# Patient Record
Sex: Female | Born: 1985 | Race: Black or African American | Hispanic: No | Marital: Married | Smoking: Never smoker
Health system: Southern US, Community
[De-identification: ages and names within clinical notes are randomized; demographics above are authoritative.]

## PROBLEM LIST (undated history)

## (undated) DIAGNOSIS — M329 Systemic lupus erythematosus, unspecified: Secondary | ICD-10-CM

## (undated) DIAGNOSIS — M4150 Other secondary scoliosis, site unspecified: Secondary | ICD-10-CM

## (undated) DIAGNOSIS — J45909 Unspecified asthma, uncomplicated: Secondary | ICD-10-CM

## (undated) HISTORY — DX: Systemic lupus erythematosus, unspecified: M32.9

## (undated) HISTORY — DX: Unspecified asthma, uncomplicated: J45.909

## (undated) HISTORY — DX: Other secondary scoliosis, site unspecified: M41.50

---
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
## 2004-09-12 ENCOUNTER — Encounter: Payer: Self-pay | Admitting: Family Medicine

## 2004-09-12 LAB — CONVERTED CEMR LAB

## 2005-05-03 ENCOUNTER — Ambulatory Visit: Payer: Self-pay | Admitting: Family Medicine

## 2005-07-09 ENCOUNTER — Ambulatory Visit: Payer: Self-pay | Admitting: Family Medicine

## 2005-09-12 ENCOUNTER — Ambulatory Visit: Payer: Self-pay | Admitting: Family Medicine

## 2005-09-18 ENCOUNTER — Ambulatory Visit: Payer: Self-pay | Admitting: Family Medicine

## 2005-11-01 ENCOUNTER — Ambulatory Visit: Payer: Self-pay | Admitting: Family Medicine

## 2006-05-20 DIAGNOSIS — M4150 Other secondary scoliosis, site unspecified: Secondary | ICD-10-CM

## 2006-05-20 DIAGNOSIS — J45909 Unspecified asthma, uncomplicated: Secondary | ICD-10-CM

## 2006-05-20 HISTORY — DX: Unspecified asthma, uncomplicated: J45.909

## 2006-05-20 HISTORY — DX: Other secondary scoliosis, site unspecified: M41.50

## 2006-05-27 ENCOUNTER — Encounter: Payer: Self-pay | Admitting: Family Medicine

## 2006-06-04 ENCOUNTER — Encounter: Payer: Self-pay | Admitting: Family Medicine

## 2007-03-19 ENCOUNTER — Encounter: Payer: Self-pay | Admitting: Family Medicine

## 2007-03-19 ENCOUNTER — Other Ambulatory Visit: Admission: RE | Admit: 2007-03-19 | Discharge: 2007-03-19 | Payer: Self-pay | Admitting: Family Medicine

## 2007-03-19 ENCOUNTER — Ambulatory Visit: Payer: Self-pay | Admitting: Family Medicine

## 2007-08-05 ENCOUNTER — Telehealth (INDEPENDENT_AMBULATORY_CARE_PROVIDER_SITE_OTHER): Payer: Self-pay | Admitting: *Deleted

## 2007-08-05 ENCOUNTER — Encounter: Admission: RE | Admit: 2007-08-05 | Discharge: 2007-08-05 | Payer: Self-pay | Admitting: Family Medicine

## 2007-08-05 ENCOUNTER — Ambulatory Visit: Payer: Self-pay | Admitting: Family Medicine

## 2007-08-05 DIAGNOSIS — M79609 Pain in unspecified limb: Secondary | ICD-10-CM

## 2007-10-20 ENCOUNTER — Ambulatory Visit: Payer: Self-pay | Admitting: Family Medicine

## 2007-10-20 ENCOUNTER — Encounter: Payer: Self-pay | Admitting: Family Medicine

## 2007-10-20 ENCOUNTER — Other Ambulatory Visit: Admission: RE | Admit: 2007-10-20 | Discharge: 2007-10-20 | Payer: Self-pay | Admitting: Family Medicine

## 2007-10-20 DIAGNOSIS — N760 Acute vaginitis: Secondary | ICD-10-CM | POA: Insufficient documentation

## 2007-10-20 LAB — CONVERTED CEMR LAB: Trich, Wet Prep: NONE SEEN

## 2008-03-31 ENCOUNTER — Ambulatory Visit: Payer: Self-pay | Admitting: Family Medicine

## 2008-03-31 DIAGNOSIS — R21 Rash and other nonspecific skin eruption: Secondary | ICD-10-CM

## 2008-03-31 DIAGNOSIS — R5383 Other fatigue: Secondary | ICD-10-CM

## 2008-03-31 DIAGNOSIS — R5381 Other malaise: Secondary | ICD-10-CM

## 2008-03-31 DIAGNOSIS — G5600 Carpal tunnel syndrome, unspecified upper limb: Secondary | ICD-10-CM

## 2008-03-31 LAB — CONVERTED CEMR LAB
Bilirubin Urine: NEGATIVE
Blood in Urine, dipstick: NEGATIVE
Glucose, Urine, Semiquant: NEGATIVE
Nitrite: NEGATIVE
Specific Gravity, Urine: 1.025
Urobilinogen, UA: 0.2
pH: 6

## 2008-04-04 ENCOUNTER — Encounter: Payer: Self-pay | Admitting: Family Medicine

## 2008-04-04 ENCOUNTER — Telehealth (INDEPENDENT_AMBULATORY_CARE_PROVIDER_SITE_OTHER): Payer: Self-pay | Admitting: *Deleted

## 2008-04-04 LAB — CONVERTED CEMR LAB
ALT: 11 units/L (ref 0–35)
ANA Titer 1: 1:320 {titer} — ABNORMAL HIGH
AST: 17 units/L (ref 0–37)
Albumin: 4 g/dL (ref 3.5–5.2)
Alkaline Phosphatase: 79 units/L (ref 39–117)
Anti Nuclear Antibody(ANA): POSITIVE — AB
BUN: 12 mg/dL (ref 6–23)
CO2: 20 meq/L (ref 19–32)
Calcium: 9.6 mg/dL (ref 8.4–10.5)
Chloride: 103 meq/L (ref 96–112)
Creatinine, Ser: 0.85 mg/dL (ref 0.40–1.20)
Folate: 19.1 ng/mL
Glucose, Bld: 106 mg/dL — ABNORMAL HIGH (ref 70–99)
HCT: 37.1 % (ref 36.0–46.0)
Hemoglobin: 13 g/dL (ref 12.0–15.0)
MCHC: 35 g/dL (ref 30.0–36.0)
MCV: 74.5 fL — ABNORMAL LOW (ref 78.0–100.0)
Platelets: 367 10*3/uL (ref 150–400)
Potassium: 3.9 meq/L (ref 3.5–5.3)
RBC: 4.98 M/uL (ref 3.87–5.11)
RDW: 13.7 % (ref 11.5–15.5)
Rheumatoid fact SerPl-aCnc: 247 intl units/mL — ABNORMAL HIGH (ref 0–20)
Sed Rate: 36 mm/hr — ABNORMAL HIGH (ref 0–22)
Sodium: 136 meq/L (ref 135–145)
TSH: 2.34 microintl units/mL (ref 0.350–4.50)
Total Bilirubin: 0.4 mg/dL (ref 0.3–1.2)
Total Protein: 8.8 g/dL — ABNORMAL HIGH (ref 6.0–8.3)
Vitamin B-12: 734 pg/mL (ref 211–911)
WBC: 9 10*3/uL (ref 4.0–10.5)

## 2008-04-05 LAB — CONVERTED CEMR LAB
Iron: 33 ug/dL — ABNORMAL LOW (ref 42–145)
Saturation Ratios: 8 % — ABNORMAL LOW (ref 20–55)
TIBC: 395 ug/dL (ref 250–470)
UIBC: 362 ug/dL

## 2008-10-18 ENCOUNTER — Encounter: Payer: Self-pay | Admitting: Family Medicine

## 2008-10-18 ENCOUNTER — Other Ambulatory Visit: Admission: RE | Admit: 2008-10-18 | Discharge: 2008-10-18 | Payer: Self-pay | Admitting: Family Medicine

## 2008-10-18 ENCOUNTER — Ambulatory Visit: Payer: Self-pay | Admitting: Family Medicine

## 2008-10-18 DIAGNOSIS — M329 Systemic lupus erythematosus, unspecified: Secondary | ICD-10-CM | POA: Insufficient documentation

## 2008-10-18 HISTORY — DX: Systemic lupus erythematosus, unspecified: M32.9

## 2008-10-19 ENCOUNTER — Encounter: Payer: Self-pay | Admitting: Family Medicine

## 2008-10-19 LAB — CONVERTED CEMR LAB
Cholesterol: 197 mg/dL (ref 0–200)
Clue Cells Wet Prep HPF POC: NONE SEEN
LDL Cholesterol: 129 mg/dL — ABNORMAL HIGH (ref 0–99)
Trich, Wet Prep: NONE SEEN
VLDL: 19 mg/dL (ref 0–40)

## 2008-10-21 ENCOUNTER — Telehealth: Payer: Self-pay | Admitting: Family Medicine

## 2008-10-21 DIAGNOSIS — R87619 Unspecified abnormal cytological findings in specimens from cervix uteri: Secondary | ICD-10-CM

## 2008-11-14 ENCOUNTER — Encounter: Payer: Self-pay | Admitting: Family Medicine

## 2009-03-01 ENCOUNTER — Telehealth (INDEPENDENT_AMBULATORY_CARE_PROVIDER_SITE_OTHER): Payer: Self-pay | Admitting: *Deleted

## 2009-03-21 ENCOUNTER — Ambulatory Visit: Payer: Self-pay | Admitting: Family Medicine

## 2009-03-21 DIAGNOSIS — J019 Acute sinusitis, unspecified: Secondary | ICD-10-CM

## 2009-03-21 DIAGNOSIS — R1313 Dysphagia, pharyngeal phase: Secondary | ICD-10-CM

## 2010-05-25 ENCOUNTER — Ambulatory Visit: Payer: Self-pay | Admitting: Family Medicine

## 2010-05-25 ENCOUNTER — Other Ambulatory Visit: Admission: RE | Admit: 2010-05-25 | Discharge: 2010-05-25 | Payer: Self-pay | Admitting: Family Medicine

## 2010-05-26 ENCOUNTER — Encounter: Payer: Self-pay | Admitting: Family Medicine

## 2010-05-27 LAB — CONVERTED CEMR LAB
AST: 47 units/L — ABNORMAL HIGH (ref 0–37)
Albumin: 4.7 g/dL (ref 3.5–5.2)
Alkaline Phosphatase: 70 units/L (ref 39–117)
Basophils Absolute: 0 10*3/uL (ref 0.0–0.1)
Basophils Relative: 1 % (ref 0–1)
Clue Cells Wet Prep HPF POC: NONE SEEN
Eosinophils Absolute: 0 10*3/uL (ref 0.0–0.7)
MCHC: 35 g/dL (ref 30.0–36.0)
MCV: 75.6 fL — ABNORMAL LOW (ref 78.0–100.0)
Neutrophils Relative %: 49 % (ref 43–77)
Platelets: 172 10*3/uL (ref 150–400)
Potassium: 4.6 meq/L (ref 3.5–5.3)
Sodium: 140 meq/L (ref 135–145)
Total Protein: 7.9 g/dL (ref 6.0–8.3)
WBC: 4.4 10*3/uL (ref 4.0–10.5)

## 2010-05-28 ENCOUNTER — Encounter: Payer: Self-pay | Admitting: Family Medicine

## 2010-05-29 LAB — CONVERTED CEMR LAB
Pap Smear: NEGATIVE
Pap Smear: NORMAL

## 2010-09-11 NOTE — Assessment & Plan Note (Signed)
Summary: F/U SLE, pap   Vital Signs:  Patient profile:   25 year old female Height:      66 inches Weight:      188 pounds BMI:     30.45 Pulse rate:   84 / minute BP sitting:   115 / 77  (right arm) Cuff size:   regular  Vitals Entered By: Avon Gully CMA, Duncan Dull) (May 25, 2010 9:18 AM) CC: needs labs for lupus   Primary Care Provider:  Nani Gasser MD  CC:  needs labs for lupus.  History of Present Illness: Last set up labs was 2 months ago. Normally goes every 6 weeks. Said A1C was elevated (6.1).  Overdue for eye exam.  Completely out of the plaquenil. Out for 3 weeks. Moved to [facility] and jut back here to visit her sister who just had surgery. She is on plaqunil but is off her steroids. She is very happy to be off the steroids. SHe has gained 30 lbs since the last time I saw her.   She also needs a pap smear.   Current Medications (verified): 1)  Albuterol 90 Mcg/act Aers (Albuterol) .... 2 Puffs Before Exercise 2)  Methotrexate 2.5 Mg Tabs (Methotrexate Sodium) .... Take 6 Pills One Day A Week 3)  Folic Acid 400 Mcg Tabs (Folic Acid) .... Take One Tablet By Mouth Once A Day 4)  Fish Oil Concentrate 1000 Mg Caps (Omega-3 Fatty Acids) .... Take One Tablet By Mouth Once A Day 5)  Multivitamins  Tabs (Multiple Vitamin) .... Take One Tablet By Mouth Once Ad Ay 6)  Plaquenil 200 Mg Tabs (Hydroxychloroquine Sulfate) .... Take 1 Tablet By Mouth Once A Day 7)  Dexilant 30 Mg Cpdr (Dexlansoprazole)  Allergies (verified): 1)  ! Lodine  Comments:  Nurse/Medical Assistant: The patient's medications and allergies were reviewed with the patient and were updated in the Medication and Allergy Lists. Avon Gully CMA, Duncan Dull) (May 25, 2010 9:19 AM)  Family History: Aunt-DM, Grandparent -depression, HTN, Great Aunt -alcoholism Sister wtih severe scoliosis.   Social History: Reviewed history from 03/19/2007 and no changes required. 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  Single.  Live at home.  Plays basketball.  Never smoked.  No EtOH, no drugs, 2 caffeinated drinks per day.  Physical Exam  General:  Well-developed,well-nourished,in no acute distress; alert,appropriate and cooperative throughout examination Head:  Normocephalic and atraumatic without obvious abnormalities. No apparent alopecia or balding. Neck:  No deformities, masses, or tenderness noted. NO TM.  Lungs:  Normal respiratory effort, chest expands symmetrically. Lungs are clear to auscultation, no crackles or wheezes. Heart:  Normal rate and regular rhythm. S1 and S2 normal without gallop, murmur, click, rub or other extra sounds. Genitalia:  Normal introitus for age, no external lesions, no vaginal discharge, mucosa pink and moist, no vaginal or cervical lesions, no vaginal atrophy, no friaility or hemorrhage, normal uterus size and position, no adnexal masses or tenderness Skin:  no rashes.   Cervical Nodes:  No lymphadenopathy noted Psych:  Cognition and judgment appear intact. Alert and cooperative with normal attention span and concentration. No apparent delusions, illusions, hallucinations   Impression & Recommendations:  Problem # 1:  SLE (ICD-710.0)  I will refill her plaquenil for 8 week. This should be plenty of time to find a new rheumatologist in [place]. Discused teh improtance of an EYE EXAM [place] as soon as gets back to [place] next week. Her last eye  exam was 8 months ago. Let her know could have permanent vision loss if she is not verey diligent about her eye exams.  The following medications were removed from the medication list:    Prednisone 5 Mg Tabs (Prednisone) .Marland Kitchen... Take one tablet by mouth once a day    Mobic 7.5 Mg Tabs (Meloxicam) .Marland Kitchen... Take 1 tablet by mouth once a day Her updated medication list for this problem includes:    Methotrexate 2.5 Mg Tabs (Methotrexate sodium) .Marland Kitchen... Take 6 pills one day a week     Plaquenil 200 Mg Tabs (Hydroxychloroquine sulfate) .Marland Kitchen... Take 1 tablet by mouth once a day  Orders: Fingerstick (84132) Hgb A1C (44010UV) T-CBC w/Diff (25366-44034) T-Comprehensive Metabolic Panel (74259-56387)  Problem # 2:  PAP SMEAR, ABNORMAL (ICD-795.00) Due for repeat pap today. Will check for GC, [place] and will send a wet prep. Had a yeast infeciton recently and use an OTC tx. Her sxs are better.   Complete Medication List: 1)  Albuterol 90 Mcg/act Aers (Albuterol) .... 2 puffs before exercise 2)  Methotrexate 2.5 Mg Tabs (Methotrexate sodium) .... Take 6 pills one day a week 3)  Folic Acid 400 Mcg Tabs (Folic acid) .... Take one tablet by mouth once a day 4)  Fish Oil Concentrate 1000 Mg Caps (Omega-3 fatty acids) .... Take one tablet by mouth once a day 5)  Multivitamins Tabs (Multiple vitamin) .... Take one tablet by mouth once ad ay 6)  Plaquenil 200 Mg Tabs (Hydroxychloroquine sulfate) .... Take 1 tablet by mouth once a day 7)  Dexilant 30 Mg Cpdr (Dexlansoprazole)  Other Orders: T-Wet Prep ([id number]) Admin 1st Vaccine (84166) Flu Vaccine 27yrs + (06301)  Patient Instructions: 1)  You must get your eye exam PRONTO!!!!!! 2)  I will refill your medicine for 8 weeks and give you a chance to find a new rheumatologist.  Prescriptions: PLAQUENIL 200 MG TABS (HYDROXYCHLOROQUINE SULFATE) Take 1 tablet by mouth once a day  #30 x 1   Entered and Authorized by:   Nani Gasser MD   Signed by:   Nani Gasser MD on 05/25/2010   Method used:   Electronically to        [facility] [phone]* (retail)       [address] [place], [place]  [address]       Ph: [phone]       Fax: [phone]   RxID:   [id number]   Laboratory Results   Blood Tests   Date/Time Received: 05/25/10 Date/Time Reported: 05/25/10  HGBA1C: 6.3%   (Normal Range: Non-Diabetic - 3-6%   Control Diabetic - 6-8%)    Flu Vaccine Consent Questions     Do you have a history of  severe allergic reactions to this vaccine? no    Any prior history of allergic reactions to egg and/or gelatin? no    Do you have a sensitivity to the preservative Thimersol? no    Do you have a past history of Guillan-Barre Syndrome? no    Do you currently have an acute febrile illness? no    Have you ever had a severe reaction to latex? no    Vaccine information given and explained to patient? yes    Are you currently pregnant? no    Lot Number:[id number]   Exp Date:02/09/2011   Site Given  Left Deltoid IM HGBA1C: 6.3%   (Normal Range: Non-Diabetic - 3-6%   Control Diabetic - 6-8%)     .  lbflu

## 2011-07-24 ENCOUNTER — Ambulatory Visit (INDEPENDENT_AMBULATORY_CARE_PROVIDER_SITE_OTHER): Payer: BC Managed Care – PPO | Admitting: Family Medicine

## 2011-07-24 ENCOUNTER — Encounter: Payer: Self-pay | Admitting: Family Medicine

## 2011-07-24 VITALS — BP 113/72 | HR 52 | Ht 67.0 in | Wt 192.0 lb

## 2011-07-24 DIAGNOSIS — M329 Systemic lupus erythematosus, unspecified: Secondary | ICD-10-CM

## 2011-07-24 DIAGNOSIS — Z23 Encounter for immunization: Secondary | ICD-10-CM

## 2011-07-24 LAB — CBC
HCT: 36.6 % (ref 36.0–46.0)
Hemoglobin: 12.9 g/dL (ref 12.0–15.0)
MCH: 27 pg (ref 26.0–34.0)
MCV: 76.6 fL — ABNORMAL LOW (ref 78.0–100.0)
RBC: 4.78 MIL/uL (ref 3.87–5.11)

## 2011-07-24 MED ORDER — METHOTREXATE 2.5 MG PO TABS: 15.0000 mg | ORAL_TABLET | ORAL | Status: DC
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
## 2011-08-09 ENCOUNTER — Encounter: Payer: Self-pay | Admitting: *Deleted

## 2012-06-05 ENCOUNTER — Ambulatory Visit (INDEPENDENT_AMBULATORY_CARE_PROVIDER_SITE_OTHER): Payer: Self-pay | Admitting: Family Medicine

## 2012-06-05 ENCOUNTER — Encounter: Payer: Self-pay | Admitting: Family Medicine

## 2012-06-05 VITALS — BP 123/80 | HR 53 | Temp 97.8°F | Wt 200.0 lb

## 2012-06-05 DIAGNOSIS — S134XXA Sprain of ligaments of cervical spine, initial encounter: Secondary | ICD-10-CM

## 2012-06-05 DIAGNOSIS — S139XXA Sprain of joints and ligaments of unspecified parts of neck, initial encounter: Secondary | ICD-10-CM

## 2012-06-05 MED ORDER — CYCLOBENZAPRINE HCL 10 MG PO TABS: 10.0000 mg | ORAL_TABLET | Freq: Three times a day (TID) | ORAL | Status: DC | PRN

## 2012-06-05 MED ORDER — IBUPROFEN 800 MG PO TABS: 800.0000 mg | ORAL_TABLET | Freq: Three times a day (TID) | ORAL | Status: DC | PRN
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
## 2012-06-15 ENCOUNTER — Encounter: Payer: Self-pay | Admitting: Family Medicine

## 2012-06-15 ENCOUNTER — Ambulatory Visit (INDEPENDENT_AMBULATORY_CARE_PROVIDER_SITE_OTHER): Payer: Self-pay | Admitting: Family Medicine

## 2012-06-15 ENCOUNTER — Other Ambulatory Visit: Payer: Self-pay | Admitting: Family Medicine

## 2012-06-15 ENCOUNTER — Ambulatory Visit (INDEPENDENT_AMBULATORY_CARE_PROVIDER_SITE_OTHER): Payer: Self-pay

## 2012-06-15 DIAGNOSIS — M549 Dorsalgia, unspecified: Secondary | ICD-10-CM

## 2012-06-15 DIAGNOSIS — M542 Cervicalgia: Secondary | ICD-10-CM

## 2012-06-15 DIAGNOSIS — M545 Low back pain, unspecified: Secondary | ICD-10-CM

## 2012-06-15 DIAGNOSIS — M546 Pain in thoracic spine: Secondary | ICD-10-CM

## 2012-06-15 NOTE — Patient Instructions (Addendum)
We will call you with the x-ray results. 
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
## 2012-06-17 ENCOUNTER — Other Ambulatory Visit: Payer: Self-pay | Admitting: Family Medicine

## 2012-06-22 ENCOUNTER — Ambulatory Visit: Payer: BC Managed Care – PPO | Attending: Family Medicine | Admitting: Physical Therapy

## 2012-06-22 DIAGNOSIS — M2550 Pain in unspecified joint: Secondary | ICD-10-CM | POA: Insufficient documentation

## 2012-06-22 DIAGNOSIS — IMO0001 Reserved for inherently not codable concepts without codable children: Secondary | ICD-10-CM | POA: Insufficient documentation

## 2012-06-22 DIAGNOSIS — M6281 Muscle weakness (generalized): Secondary | ICD-10-CM | POA: Insufficient documentation

## 2012-06-26 ENCOUNTER — Ambulatory Visit: Payer: BC Managed Care – PPO | Admitting: Physical Therapy

## 2012-06-30 ENCOUNTER — Encounter: Payer: BC Managed Care – PPO | Admitting: Physical Therapy

## 2012-07-17 ENCOUNTER — Encounter: Payer: BC Managed Care – PPO | Admitting: Physical Therapy

## 2013-03-24 ENCOUNTER — Ambulatory Visit: Payer: BC Managed Care – PPO | Admitting: Family Medicine

## 2013-03-25 ENCOUNTER — Ambulatory Visit (INDEPENDENT_AMBULATORY_CARE_PROVIDER_SITE_OTHER): Payer: BC Managed Care – PPO | Admitting: Family Medicine

## 2013-03-25 ENCOUNTER — Telehealth: Payer: Self-pay | Admitting: *Deleted

## 2013-03-25 ENCOUNTER — Encounter: Payer: Self-pay | Admitting: Family Medicine

## 2013-03-25 VITALS — BP 120/79 | HR 67 | Ht 68.0 in | Wt 168.0 lb

## 2013-03-25 DIAGNOSIS — E1165 Type 2 diabetes mellitus with hyperglycemia: Secondary | ICD-10-CM | POA: Insufficient documentation

## 2013-03-25 DIAGNOSIS — IMO0002 Reserved for concepts with insufficient information to code with codable children: Secondary | ICD-10-CM | POA: Insufficient documentation

## 2013-03-25 LAB — POCT GLYCOSYLATED HEMOGLOBIN (HGB A1C): Hemoglobin A1C: >14

## 2013-03-25 LAB — GLUCOSE, POCT (MANUAL RESULT ENTRY): POC Glucose: 268 mg/dl — AB (ref 70–99)

## 2013-03-25 MED ORDER — INSULIN DETEMIR 100 UNIT/ML FLEXPEN: 20.0000 [IU] | PEN_INJECTOR | Freq: Every day | SUBCUTANEOUS | Status: DC

## 2013-03-25 MED ORDER — AMBULATORY NON FORMULARY MEDICATION: Status: DC

## 2013-03-25 MED ORDER — SAXAGLIPTIN-METFORMIN ER 5-500 MG PO TB24: 1.0000 | ORAL_TABLET | Freq: Every day | ORAL | Status: DC

## 2013-03-25 MED ORDER — SAXAGLIPTIN-METFORMIN ER 5-1000 MG PO TB24: 1.0000 | ORAL_TABLET | Freq: Every day | ORAL | Status: DC

## 2013-03-25 NOTE — Telephone Encounter (Signed)
Spoke with Kendra Hart at [facility] to clarify that pt was to get Kombiglyze 12-998; not 5-500.

## 2013-03-25 NOTE — Progress Notes (Signed)
Subjective:    Patient ID: Kendra Hart, female    DOB: [birth year], 27 y.o.   MRN: [id number]  HPI Kendra Hart to UC for an abscess for about a week ago. Had and I&D.  Had noticed some blurry vision and inc urination. Went to rheumatology and had glucose checked 2 days ago and glucose was 590. Next day went down to 400. A1C was > 16.  Her fiance has Type 1 DM.  She denies any recent fever or illnesses. No abdominal pain. She has noticed some tingling and discomfort in her right foot a couple weeks ago but that has resolved. She does have a family history of diabetes. She also has lupus and is currently on immunosuppressive therapy.   Review of Systems BP 120/79  Pulse 67  Ht 5\' 8"  (1.727 m)  Wt 168 lb (76.204 kg)  BMI 25.55 kg/m2    Allergies  Allergen Reactions  . Etodolac     REACTION: hives, SOB    Past Medical History  Diagnosis Date  . SLE 10/18/2008    Qualifier: Diagnosis of  By: Linford Arnold MD, Santina Evans    . SCOLIOSIS 05/20/2006    Qualifier: Diagnosis of  By: Linford Arnold MD, Santina Evans    . EXERCISE INDUCED ASTHMA 05/20/2006    Qualifier: Diagnosis of  By: Linford Arnold MD, Santina Evans      No past surgical history on file.  History   Social History  . Marital Status: Single    Spouse Name: N/A    Number of Children: N/A  . Years of Education: N/A   Occupational History  . Not on file.   Social History Main Topics  . Smoking status: Never Smoker   . Smokeless tobacco: Not on file  . Alcohol Use: Not on file  . Drug Use: Not on file  . Sexual Activity: Not on file   Other Topics Concern  . Not on file   Social History Narrative  . No narrative on file    No family history on file.  Outpatient Encounter Prescriptions as of 03/25/2013  Medication Sig Dispense Refill  . AMBULATORY NON FORMULARY MEDICATION Medication Name: Glucometer, strips and lancets.  Test 1 x a day.  On insulin 250.00  1 Units  0  . cyclobenzaprine (FLEXERIL) 10 MG tablet Take 1 tablet (10 mg total) by  mouth every 8 (eight) hours as needed for muscle spasms.  30 tablet  1  . hydroxychloroquine (PLAQUENIL) 200 MG tablet Take 200 mg by mouth daily.        Marland Kitchen ibuprofen (ADVIL,MOTRIN) 800 MG tablet Take 1 tablet (800 mg total) by mouth every 8 (eight) hours as needed for pain.  45 tablet  1  . Insulin Detemir (LEVEMIR FLEXPEN) 100 UNIT/ML SOPN Inject 20 Units into the skin at bedtime.  4 pen  1  . methotrexate (RHEUMATREX) 2.5 MG tablet Take 6 tablets (15 mg total) by mouth once a week. Caution:Chemotherapy. Protect from light.  24 tablet  1  . Saxagliptin-Metformin (KOMBIGLYZE XR) 5-500 MG TB24 Take 1 tablet by mouth daily.  30 tablet  2   No facility-administered encounter medications on file as of 03/25/2013.           Objective:   Physical Exam  Constitutional: She is oriented to person, place, and time. She appears well-developed and well-nourished.  HENT:  Head: Normocephalic and atraumatic.  Neck: Neck supple. No thyromegaly present.  Cardiovascular: Normal rate, regular rhythm and normal heart sounds.   Pulmonary/Chest:  Effort normal and breath sounds normal.  Lymphadenopathy:    She has no cervical adenopathy.  Neurological: She is alert and oriented to person, place, and time.  Skin: Skin is warm and dry.  Psychiatric: She has a normal mood and affect. Her behavior is normal.          Assessment & Plan:  DM, type 2 - new diagnosis, new onset. discuss tx options.  We will start with a combination medication. Start Kombiglyze 12/998 XR once daily. We will also add Levemir 10 units at bedtime. She will increase by 1 unit each night as her fasting sugar is over 140. When her sugar starts to drop low 140 she can stay at that current dose until I see her back in 2-3 weeks. Given a prescription to get a glucometer that is covered by her insurance. I encouraged her to get diabetic and nutrition counseling. She has taken nutrition, classes in the past and says she's not sure she would  like to do that but will think about it. We also had a discussion about major dietary changes. Cutting back on concentrated sweets, carbohydrates, and sweetened beverages. Adjust her medication when I see her back. Call if she has any palms or concerns. We did discuss the long-term effects of uncontrolled diabetes. I would like to do some additional testing to see she still has some pancreatic function.will need to check lipids once A1C is down.

## 2013-03-26 LAB — C-PEPTIDE: C-Peptide: 0.53 ng/mL — ABNORMAL LOW (ref 0.80–3.90)

## 2013-03-29 LAB — GLUTAMIC ACID DECARBOXYLASE AUTO ABS: Glutamic Acid Decarb Ab: >30 U/mL — ABNORMAL HIGH (ref <=1.0)

## 2013-04-15 ENCOUNTER — Ambulatory Visit: Payer: BC Managed Care – PPO | Admitting: Family Medicine

## 2013-04-16 ENCOUNTER — Encounter: Payer: Self-pay | Admitting: Family Medicine

## 2013-04-16 ENCOUNTER — Ambulatory Visit (INDEPENDENT_AMBULATORY_CARE_PROVIDER_SITE_OTHER): Payer: BC Managed Care – PPO | Admitting: Family Medicine

## 2013-04-16 VITALS — BP 114/70 | HR 64 | Wt 170.0 lb

## 2013-04-16 DIAGNOSIS — Z23 Encounter for immunization: Secondary | ICD-10-CM

## 2013-04-16 NOTE — Progress Notes (Signed)
  Subjective:    Patient ID: Kendra Hart, female    DOB: 12-01-1985, 27 y.o.   MRN: [id number]  HPI DM- Fairly new dx.  On kombiglyze and levemir.  Labs came back showing. C-peptide levels were low indicating that she is not making a lot of natural insulin. Similar to a type I diabetic. She does have a history of lupus.  AM sugars have been running  Around 200.  On levemir 24 units for 2-3 days.  She was able to get her glucometer without any problems. She has not had any problems administering the Levemir  Lab Results  Component Value Date   HGBA1C >14 03/25/2013      Review of Systems     Objective:   Physical Exam  Constitutional: She is oriented to person, place, and time. She appears well-developed and well-nourished.  HENT:  Head: Normocephalic and atraumatic.  Cardiovascular: Normal rate, regular rhythm and normal heart sounds.   Pulmonary/Chest: Effort normal and breath sounds normal.  Neurological: She is alert and oriented to person, place, and time.  Skin: Skin is warm and dry.  Psychiatric: She has a normal mood and affect. Her behavior is normal.          Assessment & Plan:  DM-  F/u in 06/26/13.  Increase levemir by one unit a day until glucose under 150 consistantly.  Foot exam performed today.  Due for flu shot and penumonia vaccine. We discussed potentially referring her to endocrinology because she does fit more of a type I diabetic picture. She said she wanted to hold off at this time and to see how everything looks at her followup in about 2 months for her next A1c. I think this is reasonable. Continue to work on regular exercise and healthy diet. She's doing fantastic so far.

## 2013-04-19 LAB — COMPLETE METABOLIC PANEL WITH GFR
CO2: 27 mEq/L (ref 19–32)
Creat: 0.56 mg/dL (ref 0.50–1.10)
GFR, Est African American: >89 mL/min
GFR, Est Non African American: >89 mL/min
Glucose, Bld: 133 mg/dL — ABNORMAL HIGH (ref 70–99)
Total Bilirubin: 1.2 mg/dL (ref 0.3–1.2)

## 2013-04-19 LAB — LIPID PANEL
HDL: 37 mg/dL — ABNORMAL LOW (ref >39)
Triglycerides: 72 mg/dL (ref <150)

## 2013-05-21 ENCOUNTER — Other Ambulatory Visit: Payer: Self-pay | Admitting: Family Medicine

## 2013-06-02 ENCOUNTER — Telehealth: Payer: Self-pay | Admitting: Family Medicine

## 2013-06-02 MED ORDER — AMBULATORY NON FORMULARY MEDICATION: Status: AC

## 2013-06-02 NOTE — Telephone Encounter (Signed)
Pt states she will think about the statin and will discuss it at her next visit.  Meyer Cory, LPN

## 2013-06-02 NOTE — Telephone Encounter (Signed)
Call patient: Based on new guidelines were all diabetics a lipid lowering medication called a statin is recommended. If you're okay with [place] prescribing this and please let me know and I will send her for prescription. She will take at bedtime daily. If she wants to think about it we can certainly discuss it further at her next office visit.

## 2013-06-07 ENCOUNTER — Telehealth: Payer: Self-pay

## 2013-06-07 MED ORDER — INSULIN PEN NEEDLE 32G X 4 MM MISC: Status: DC

## 2013-06-07 MED ORDER — INSULIN PEN NEEDLE 32G X 4 MM MISC: Status: AC

## 2013-06-07 NOTE — Telephone Encounter (Signed)
Sent prescription to pharmacy.  

## 2013-06-28 ENCOUNTER — Ambulatory Visit: Payer: BC Managed Care – PPO | Admitting: Family Medicine

## 2013-07-02 ENCOUNTER — Encounter: Payer: Self-pay | Admitting: Family Medicine

## 2013-07-02 ENCOUNTER — Ambulatory Visit (INDEPENDENT_AMBULATORY_CARE_PROVIDER_SITE_OTHER): Payer: BC Managed Care – PPO | Admitting: Family Medicine

## 2013-07-02 VITALS — BP 118/74 | HR 70 | Wt 182.0 lb

## 2013-07-02 MED ORDER — INSULIN DETEMIR 100 UNIT/ML FLEXPEN: 40.0000 [IU] | PEN_INJECTOR | Freq: Every day | SUBCUTANEOUS | Status: DC

## 2013-07-02 MED ORDER — INSULIN DETEMIR 100 UNIT/ML FLEXPEN: 20.0000 [IU] | PEN_INJECTOR | Freq: Every day | SUBCUTANEOUS | Status: DC

## 2013-07-02 NOTE — Progress Notes (Signed)
  Subjective:    Patient ID: Kendra Hart, female    DOB: August 18, 1985, 27 y.o.   MRN: [id number]  HPI DM- diagnosed with diabetes 3 months ago. We started her off with 10 units of Levemir. She has been slowly titrating up over the last 3 months. giving 40 units at bedtime and seeing numbers in low 100 for the past 10 days. Tolerating the kombiglyze well. No hypoglycemic events.  Doing well with herdiet changes. No wounds that aren't healing well.     Review of Systems     Objective:   Physical Exam  Constitutional: She is oriented to person, place, and time. She appears well-developed and well-nourished.  HENT:  Head: Normocephalic and atraumatic.  Neck: Neck supple. No thyromegaly present.  Cardiovascular: Normal rate, regular rhythm and normal heart sounds.   Pulmonary/Chest: Effort normal and breath sounds normal.  Lymphadenopathy:    She has no cervical adenopathy.  Neurological: She is alert and oriented to person, place, and time.  Skin: Skin is warm and dry.  Psychiatric: She has a normal mood and affect. Her behavior is normal.          Assessment & Plan:  DM- uncontrolled but improving significantly. Her A1c is down to 9.9 an attempt at her home blood sugars have actually looked great for about the last week and a half. We will continue current regimen and see her back in 3 months. We can discuss starting a statin at that time based on new guidelines. Continue work on diet and exercise and weight loss. Did send her for refills for Levemir today. She has coupon cards for both of the medications. She is not on ACE inhibitor but does not have high blood pressure or any renal disease to warrant an ACE inhibitor. Lab Results  Component Value Date   HGBA1C 9.9 07/02/2013

## 2013-07-28 ENCOUNTER — Other Ambulatory Visit: Payer: Self-pay | Admitting: Family Medicine

## 2013-08-11 ENCOUNTER — Encounter: Payer: Self-pay | Admitting: Family Medicine

## 2013-08-11 ENCOUNTER — Ambulatory Visit (INDEPENDENT_AMBULATORY_CARE_PROVIDER_SITE_OTHER): Payer: BC Managed Care – PPO | Admitting: Family Medicine

## 2013-08-11 VITALS — BP 123/86 | HR 64 | Temp 97.5°F | Wt 182.0 lb

## 2013-08-11 DIAGNOSIS — J329 Chronic sinusitis, unspecified: Secondary | ICD-10-CM

## 2013-08-11 DIAGNOSIS — B9689 Other specified bacterial agents as the cause of diseases classified elsewhere: Secondary | ICD-10-CM

## 2013-08-11 DIAGNOSIS — A499 Bacterial infection, unspecified: Secondary | ICD-10-CM

## 2013-08-11 MED ORDER — AMOXICILLIN-POT CLAVULANATE 500-125 MG PO TABS: ORAL_TABLET | ORAL | Status: AC

## 2013-08-11 NOTE — Progress Notes (Signed)
CC: Kendra Hart is a 27 y.o. female is here for Sinusitis   Subjective: HPI:  Complains of facial pressure below the eyes between the eyes moderate nasal congestion there has been worsening for the past week take green discharge. Pressures described as moderate and nonradiating worse when lying down at night. Symptoms have not improved with Advil cold and sinus, no other interventions. Nothing else makes it better or worse. Accompanied by subjective postnasal drip and nonproductive cough without shortness of breath or chest pain. Denies fevers, chills, nausea, motor sensory disturbances but does endorse moderate fatigue   Review Of Systems Outlined In HPI  Past Medical History  Diagnosis Date  . SLE 10/18/2008    Qualifier: Diagnosis of  By: Linford Arnold MD, Santina Evans    . SCOLIOSIS 05/20/2006    Qualifier: Diagnosis of  By: Linford Arnold MD, Santina Evans    . EXERCISE INDUCED ASTHMA 05/20/2006    Qualifier: Diagnosis of  By: Linford Arnold MD, Santina Evans       No family history on file.   History  Substance Use Topics  . Smoking status: Never Smoker   . Smokeless tobacco: Not on file  . Alcohol Use: Not on file     Objective: Filed Vitals:   08/11/13 1019  BP: 123/86  Pulse: 64  Temp: 97.5 F (36.4 C)    General: Alert and Oriented, No Acute Distress HEENT: Pupils equal, round, reactive to light. Conjunctivae clear.  External ears unremarkable, canals clear with intact TMs with appropriate landmarks.  Middle ear appears open without effusion. Pink inferior turbinates with moderate mucoid discharge.  Moist mucous membranes, pharynx without inflammation nor lesions however moderate cobblestoning.  Neck supple without palpable lymphadenopathy nor abnormal masses. Lungs: Clear to auscultation bilaterally, no wheezing/ronchi/rales.  Comfortable work of breathing. Good air movement. Mental Status: No depression, anxiety, nor agitation. Skin: Warm and dry.  Assessment & Plan: Darnell was seen today  for sinusitis.  Diagnoses and associated orders for this visit:  Bacterial sinusitis - amoxicillin-clavulanate (AUGMENTIN) 500-125 MG per tablet; Take one by mouth every 8 hours for ten total days.    Bacterial sinusitis: Start Augmentin consider continuing cold and sinus medication such as Alka-Seltzer, encouraged to use nasal saline washes  Return if symptoms worsen or fail to improve.

## 2013-09-02 ENCOUNTER — Other Ambulatory Visit: Payer: Self-pay | Admitting: Family Medicine

## 2013-09-24 ENCOUNTER — Encounter: Payer: Self-pay | Admitting: Physician Assistant

## 2013-09-24 ENCOUNTER — Ambulatory Visit (INDEPENDENT_AMBULATORY_CARE_PROVIDER_SITE_OTHER): Payer: BC Managed Care – PPO | Admitting: Physician Assistant

## 2013-09-24 VITALS — BP 114/77 | HR 70 | Temp 97.6°F | Wt 187.0 lb

## 2013-09-24 DIAGNOSIS — L732 Hidradenitis suppurativa: Secondary | ICD-10-CM

## 2013-09-24 DIAGNOSIS — L0291 Cutaneous abscess, unspecified: Secondary | ICD-10-CM

## 2013-09-24 DIAGNOSIS — L0390 Cellulitis, unspecified: Secondary | ICD-10-CM

## 2013-09-24 DIAGNOSIS — J019 Acute sinusitis, unspecified: Secondary | ICD-10-CM

## 2013-09-24 MED ORDER — SULFAMETHOXAZOLE-TRIMETHOPRIM 800-160 MG PO TABS: 1.0000 | ORAL_TABLET | Freq: Two times a day (BID) | ORAL | Status: DC

## 2013-09-24 MED ORDER — DOXYCYCLINE HYCLATE 100 MG PO TABS: 100.0000 mg | ORAL_TABLET | Freq: Two times a day (BID) | ORAL | Status: DC

## 2013-09-24 NOTE — Patient Instructions (Signed)
Hidradenitis Suppurativa, Sweat Gland Abscess °Hidradenitis suppurativa is a long lasting (chronic), uncommon disease of the sweat glands. With this, boil-like lumps and scarring develop in the groin, some times under the arms (axillae), and under the breasts. It may also uncommonly occur behind the ears, in the crease of the buttocks, and around the genitals.  °CAUSES  °The cause is from a blocking of the sweat glands. They then become infected. It may cause drainage and odor. It is not contagious. So it cannot be given to someone else. It most often shows up in puberty (about 10 to 29 years of age). But it may happen much later. It is similar to acne which is a disease of the sweat glands. This condition is slightly more common in African-Americans and women. °SYMPTOMS  °· Hidradenitis usually starts as one or more red, tender, swellings in the groin or under the arms (axilla). °· Over a period of hours to days the lesions get larger. They often open to the skin surface, draining clear to yellow-colored fluid. °· The infected area heals with scarring. °DIAGNOSIS  °Your caregiver makes this diagnosis by looking at you. Sometimes cultures (growing germs on plates in the lab) may be taken. This is to see what germ (bacterium) is causing the infection.  °TREATMENT  °· Topical germ killing medicine applied to the skin (antibiotics) are the treatment of choice. Antibiotics taken by mouth (systemic) are sometimes needed when the condition is getting worse or is severe. °· Avoid tight-fitting clothing which traps moisture in. °· Dirt does not cause hidradenitis and it is not caused by poor hygiene. °· Involved areas should be cleaned daily using an antibacterial soap. Some patients find that the liquid form of Lever 2000®, applied to the involved areas as a lotion after bathing, can help reduce the odor related to this condition. °· Sometimes surgery is needed to drain infected areas or remove scarred tissue. Removal of  large amounts of tissue is used only in severe cases. °· Birth control pills may be helpful. °· Oral retinoids (vitamin A derivatives) for 6 to 12 months which are effective for acne may also help this condition. °· Weight loss will improve but not cure hidradenitis. It is made worse by being overweight. But the condition is not caused by being overweight. °· This condition is more common in people who have had acne. °· It may become worse under stress. °There is no medical cure for hidradenitis. It can be controlled, but not cured. The condition usually continues for years with periods of getting worse and getting better (remission). °Document Released: 03/12/2004 Document Revised: 10/21/2011 Document Reviewed: 03/28/2008 °Exit[employer]® Patient Information ©2014 [employer]. ° °

## 2013-09-24 NOTE — Progress Notes (Signed)
   Subjective:    Patient ID: Kendra Hart, female    DOB: August 30, 1985, 28 y.o.   MRN: [id number]  HPI Pt is a 28 yo female who presents to the clinic with sinus pressure, cough, ST, left ear pain for 2 days. Her teeth hurt. No fever, chills, SOB or wheezing. Tried advil allergy with no relief. Drainage has started to change to green over past 12 hours.   Pt has boil like bum on right inner thigh. Present for 4 days. No drainage. She occasionally will get. Tried warm compresses but have not really helped. Tender to touch. Has got in same area before. No fever, chills.      Review of Systems     Objective:   Physical Exam  Constitutional: She is oriented to person, place, and time.  Neurological: She is alert and oriented to person, place, and time.  Skin: Skin is dry.     Flushed cheeks.  Psychiatric: She has a normal mood and affect. Her behavior is normal.          Assessment & Plan:  Sinusitis- Discussed with pt likey viral since symptoms just started two days ago. Discussed mucinex D, delsym for cough, zyrtec D, ibuprofen for pain, stay hydrated. Call if not improvement.   hidradenitis suppurativa/abscess- Some abscess forming in inguinal area of right leg. Gave bactrim for 10 days. Pt feels like doxy may make her nauseated. Discussed warm compresses and ways to decrease outbreaks. Call if not improving or worsening.

## 2013-09-26 DIAGNOSIS — L732 Hidradenitis suppurativa: Secondary | ICD-10-CM | POA: Insufficient documentation

## 2013-10-02 ENCOUNTER — Other Ambulatory Visit: Payer: Self-pay | Admitting: Family Medicine

## 2013-10-21 ENCOUNTER — Encounter: Payer: Self-pay | Admitting: Family Medicine

## 2013-10-21 ENCOUNTER — Ambulatory Visit (INDEPENDENT_AMBULATORY_CARE_PROVIDER_SITE_OTHER): Payer: BC Managed Care – PPO | Admitting: Family Medicine

## 2013-10-21 VITALS — BP 112/68 | HR 70 | Temp 98.2°F | Resp 16 | Ht 67.0 in | Wt 190.8 lb

## 2013-10-21 DIAGNOSIS — IMO0001 Reserved for inherently not codable concepts without codable children: Secondary | ICD-10-CM

## 2013-10-21 DIAGNOSIS — E1165 Type 2 diabetes mellitus with hyperglycemia: Principal | ICD-10-CM

## 2013-10-21 DIAGNOSIS — Z23 Encounter for immunization: Secondary | ICD-10-CM

## 2013-10-21 LAB — POCT GLYCOSYLATED HEMOGLOBIN (HGB A1C): HEMOGLOBIN A1C: 8.4

## 2013-10-21 MED ORDER — FLUCONAZOLE 150 MG PO TABS: 150.0000 mg | ORAL_TABLET | Freq: Once | ORAL | Status: DC

## 2013-10-21 MED ORDER — EMPAGLIFLOZIN 10 MG PO TABS: 10.0000 mg | ORAL_TABLET | Freq: Every day | ORAL | Status: DC

## 2013-10-21 NOTE — Progress Notes (Signed)
   Subjective:    Patient ID: Kendra Hart, female    DOB: August 29, 1985, 28 y.o.   MRN: [id number]  HPI Diabetes - no hypoglycemic events. No wounds or sores that are not healing well. No increased thirst or urination. Checking glucose at home. Taking medications as prescribed without any side effects. During basketball season she hasn't been able to eat as regularly as she would like but she has been trying to make better food choices. She is giving herself 40 units of Levemir daily. She's also taking her Kombiglyze regularly. She does have a history of recent yeast infection after taking antibiotics for an abscess. Abscess has healed well and this is not having any problems with it.   Review of Systems     Objective:   Physical Exam  Constitutional: She is oriented to person, place, and time. She appears well-developed and well-nourished.  HENT:  Head: Normocephalic and atraumatic.  Eyes: Conjunctivae and EOM are normal.  Cardiovascular: Normal rate.   Pulmonary/Chest: Effort normal.  Neurological: She is alert and oriented to person, place, and time.  Skin: Skin is dry. No pallor.  Psychiatric: She has a normal mood and affect. Her behavior is normal.          Assessment & Plan:  Diabetes-uncontrolled though her A1c is actually much better this time. She's make great progress. Now that basketball season is over she plans on really focusing on her diet and exercise. She'll be able to eat more consistently at regular times with her meal choices. We discussed different options. I would like to add one of the newer medications to her regimen of Kombiglyze and Levemir. One, to minimize her having to increase her insulin. 2 to help her with weight loss. Given sample of Jardiance 10 mg take once a day. Coupon card given. She can call me after 3 weeks to see if it's working well without any significant side effects. Did discuss potential side effects including increased urination, yeast  infections, and UTIs. We also discussed not recommending a statin for all patients with diabetes even though she does have fantastic cholesterol levels. She says she will think about between now and her followup visit. I did go ahead and give her prescription for Diflucan in case she feels like she does develop a yeast infection.

## 2013-10-31 ENCOUNTER — Other Ambulatory Visit: Payer: Self-pay | Admitting: Family Medicine

## 2013-11-09 ENCOUNTER — Telehealth: Payer: Self-pay | Admitting: *Deleted

## 2013-11-09 MED ORDER — EMPAGLIFLOZIN 10 MG PO TABS: 10.0000 mg | ORAL_TABLET | Freq: Every day | ORAL | Status: DC

## 2013-11-09 NOTE — Telephone Encounter (Signed)
Refill sent to pharmacy.Kendra Hart  

## 2013-12-30 ENCOUNTER — Ambulatory Visit (INDEPENDENT_AMBULATORY_CARE_PROVIDER_SITE_OTHER): Payer: BC Managed Care – PPO | Admitting: Family Medicine

## 2013-12-30 ENCOUNTER — Encounter: Payer: Self-pay | Admitting: Family Medicine

## 2013-12-30 VITALS — BP 108/78 | HR 70 | Wt 186.0 lb

## 2013-12-30 DIAGNOSIS — E1165 Type 2 diabetes mellitus with hyperglycemia: Principal | ICD-10-CM

## 2013-12-30 DIAGNOSIS — IMO0001 Reserved for inherently not codable concepts without codable children: Secondary | ICD-10-CM

## 2013-12-30 LAB — POCT GLYCOSYLATED HEMOGLOBIN (HGB A1C): HEMOGLOBIN A1C: 7

## 2013-12-30 MED ORDER — SAXAGLIPTIN-METFORMIN ER 5-1000 MG PO TB24: ORAL_TABLET | ORAL | Status: DC

## 2013-12-30 MED ORDER — INSULIN DETEMIR 100 UNIT/ML FLEXPEN: 40.0000 [IU] | PEN_INJECTOR | Freq: Every day | SUBCUTANEOUS | Status: DC

## 2013-12-30 NOTE — Progress Notes (Signed)
   Subjective:    Patient ID: Kendra Hart, female    DOB: 16-Oct-1985, 28 y.o.   MRN: [id number]  HPI He today for diabetic followup. She is a little bit early but she is probably going to move same. She just got a job at [employer]. She's been doing well on her medications including the Jardiance which we just started when I saw her about 2 months ago. She had to urinate a lot when she first started it.  Has been wokring out for 30 min daily.  Has lost about 3 lbs.  Occ will decrease her lantus to 30 units.  Only 1 low.    Review of Systems     Objective:   Physical Exam  Constitutional: She is oriented to person, place, and time. She appears well-developed and well-nourished.  HENT:  Head: Normocephalic and atraumatic.  Cardiovascular: Normal rate, regular rhythm and normal heart sounds.   Pulmonary/Chest: Effort normal and breath sounds normal.  Neurological: She is alert and oriented to person, place, and time.  Skin: Skin is warm and dry.  Psychiatric: She has a normal mood and affect. Her behavior is normal.          Assessment & Plan:  Diabetes-has lost 4 lbs. She had done fantastic. Her A1c is 7.0 today which is fantastic. We'll continue current regimen. As she continues to work out and lose weight she can try decreasing the Lantus down to 38 units. She ultimately would like to get all the insulin. I discussed with her the option of one of the newer agents such as Victoza or Byetta in its place which can also help her lose weight over the long-term. I gave her some additional information a handout she can certainly think about it. Recommend followup in 3-4 months if she's able to do no she's leaving soon. 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 Eye exam is UTD and foot exam is TUD.

## 2013-12-30 NOTE — Patient Instructions (Signed)
Liraglutide injection  What is this medicine?  LIRAGLUTIDE (LIR a GLOO tide) is used to improve blood sugar control in adults with type 2 diabetes. This medicine may be used with other oral diabetes medicines.  This medicine may be used for other purposes; ask your health care provider or pharmacist if you have questions.  COMMON BRAND NAME(S): Victoza  What should I tell my health care provider before I take this medicine?  They need to know if you have any of these conditions:  -endocrine tumors (MEN 2) or if someone in your family had these tumors  -gallstones  -high cholesterol  -history of alcohol abuse problem  -history of pancreatitis  -kidney disease or if you are on dialysis  -liver disease  -previous swelling of the tongue, face, or lips with difficulty breathing, difficulty swallowing, hoarseness, or tightening of the throat  -stomach problems  -thyroid cancer or if someone in your family had thyroid cancer  -an unusual or allergic reaction to liraglutide, medicines, foods, dyes, or preservatives  -pregnant or trying to get pregnant  -breast-feeding  How should I use this medicine?  This medicine is for injection under the skin of your upper leg, stomach area, or upper arm. You will be taught how to prepare and give this medicine. Use exactly as directed. Take your medicine at regular intervals. Do not take it more often than directed.  It is important that you put your used needles and syringes in a special sharps container. Do not put them in a trash can. If you do not have a sharps container, call your pharmacist or healthcare provider to get one.  A special MedGuide will be given to you by the pharmacist with each prescription and refill. Be sure to read this information carefully each time.  Talk to your pediatrician regarding the use of this medicine in children. Special care may be needed.  Overdosage: If you think you've taken too much of this medicine contact a poison control center or emergency  room at once.  Overdosage: If you think you have taken too much of this medicine contact a poison control center or emergency room at once.  NOTE: This medicine is only for you. Do not share this medicine with others.  What if I miss a dose?  If you miss a dose, take it as soon as you can. If it is almost time for your next dose, take only that dose. Do not take double or extra doses.  What may interact with this medicine?  -acetaminophen  -atorvastatin  -birth control pills  -digoxin  -griseofulvin  -lisinoprilMany medications may cause changes in blood sugar, these include:  -alcohol containing beverages  -aspirin and aspirin-like drugs  -chloramphenicol  -chromium  -diuretics  -female hormones, such as estrogens or progestins, birth control pills  -heart medicines  -isoniazid  -female hormones or anabolic steroids  -medications for weight loss  -medicines for allergies, asthma, cold, or cough  -medicines for mental problems  -medicines called MAO inhibitors - Nardil, Parnate, Marplan, Eldepryl  -niacin  -NSAIDS, such as ibuprofen  -pentamidine  -phenytoin  -probenecid  -quinolone antibiotics such as ciprofloxacin, levofloxacin, ofloxacin  -some herbal dietary supplements  -steroid medicines such as prednisone or cortisone  -thyroid hormonesSome medications can hide the warning symptoms of low blood sugar (hypoglycemia). You may need to monitor your blood sugar more closely if you are taking one of these medications. These include:  -beta-blockers, often used for high blood pressure or heart problems (  examples include atenolol, metoprolol, propranolol)  -clonidine  -guanethidine  -reserpine  This list may not describe all possible interactions. Give your health care provider a list of all the medicines, herbs, non-prescription drugs, or dietary supplements you use. Also tell them if you smoke, drink alcohol, or use illegal drugs. Some items may interact with your medicine.  What should I watch for while using this  medicine?  Visit your doctor or health care professional for regular checks on your progress.  A test called the HbA1C (A1C) will be monitored. This is a simple blood test. It measures your blood sugar control over the last 2 to 3 months. You will receive this test every 3 to 6 months.  Learn how to check your blood sugar. Learn the symptoms of low and high blood sugar and how to manage them.  Always carry a quick-source of sugar with you in case you have symptoms of low blood sugar. Examples include hard sugar candy or glucose tablets. Make sure others know that you can choke if you eat or drink when you develop serious symptoms of low blood sugar, such as seizures or unconsciousness. They must get medical help at once.  Tell your doctor or health care professional if you have high blood sugar. You might need to change the dose of your medicine. If you are sick or exercising more than usual, you might need to change the dose of your medicine.  Do not skip meals. Ask your doctor or health care professional if you should avoid alcohol. Many nonprescription cough and cold products contain sugar or alcohol. These can affect blood sugar.  Wear a medical ID bracelet or chain, and carry a card that describes your disease and details of your medicine and dosage times.  What side effects may I notice from receiving this medicine?  Side effects that you should report to your doctor or health care professional as soon as possible:  -allergic reactions like skin rash, itching or hives, swelling of the face, lips, or tongue  -breathing problems  -fever, chills  -loss of appetite  -signs and symptoms of low blood sugar such as feeling anxious, confusion, dizziness, increased hunger, unusually weak or tired, sweating, shakiness, cold, irritable, headache, blurred vision, fast heartbeat, loss of consciousness  -trouble passing urine or change in the amount of urine  -unusual stomach pain or upset  -vomiting   Side effects that  usually do not require medical attention (Report these to your doctor or health care professional if they continue or are bothersome.):  -diarrhea  -headache  -nausea  This list may not describe all possible side effects. Call your doctor for medical advice about side effects. You may report side effects to FDA at 1-800-FDA-1088.  Where should I keep my medicine?  Keep out of the reach of children.  Store unopened pen in a refrigerator between 2 and 8 degrees C (36 and 46 degrees F). Do not freeze or use if the medicine has been frozen. Protect from light and excessive heat. After you first use the pen, it can be stored at room temperature between 15 and 30 degrees C (59 and 86 degrees F) or in a refrigerator. Throw away your used pen after 30 days or after the expiration date, whichever comes first.  Do not store your pen with the needle attached. If the needle is left on, medicine may leak from the pen.  NOTE: This sheet is a summary. It may not cover all possible information.   If you have questions about this medicine, talk to your doctor, pharmacist, or health care provider.  © 2014, Elsevier/[employer]. (2012-11-11 12:24:45)

## 2014-01-21 ENCOUNTER — Ambulatory Visit: Payer: BC Managed Care – PPO | Admitting: Family Medicine

## 2014-03-07 ENCOUNTER — Other Ambulatory Visit: Payer: Self-pay | Admitting: Family Medicine

## 2014-04-23 ENCOUNTER — Other Ambulatory Visit: Payer: Self-pay | Admitting: Family Medicine

## 2014-05-30 ENCOUNTER — Other Ambulatory Visit: Payer: Self-pay | Admitting: Family Medicine

## 2014-06-18 IMAGING — CR DG CERVICAL SPINE 2 OR 3 VIEWS
3 series · 3 of 3 positions shown · non-contrast
Comparison: None.

CLINICAL DATA: Neck pain.  Left radiculopathy.

CERVICAL SPINE - 2-3 VIEW

[view not recorded (1 of 3)]
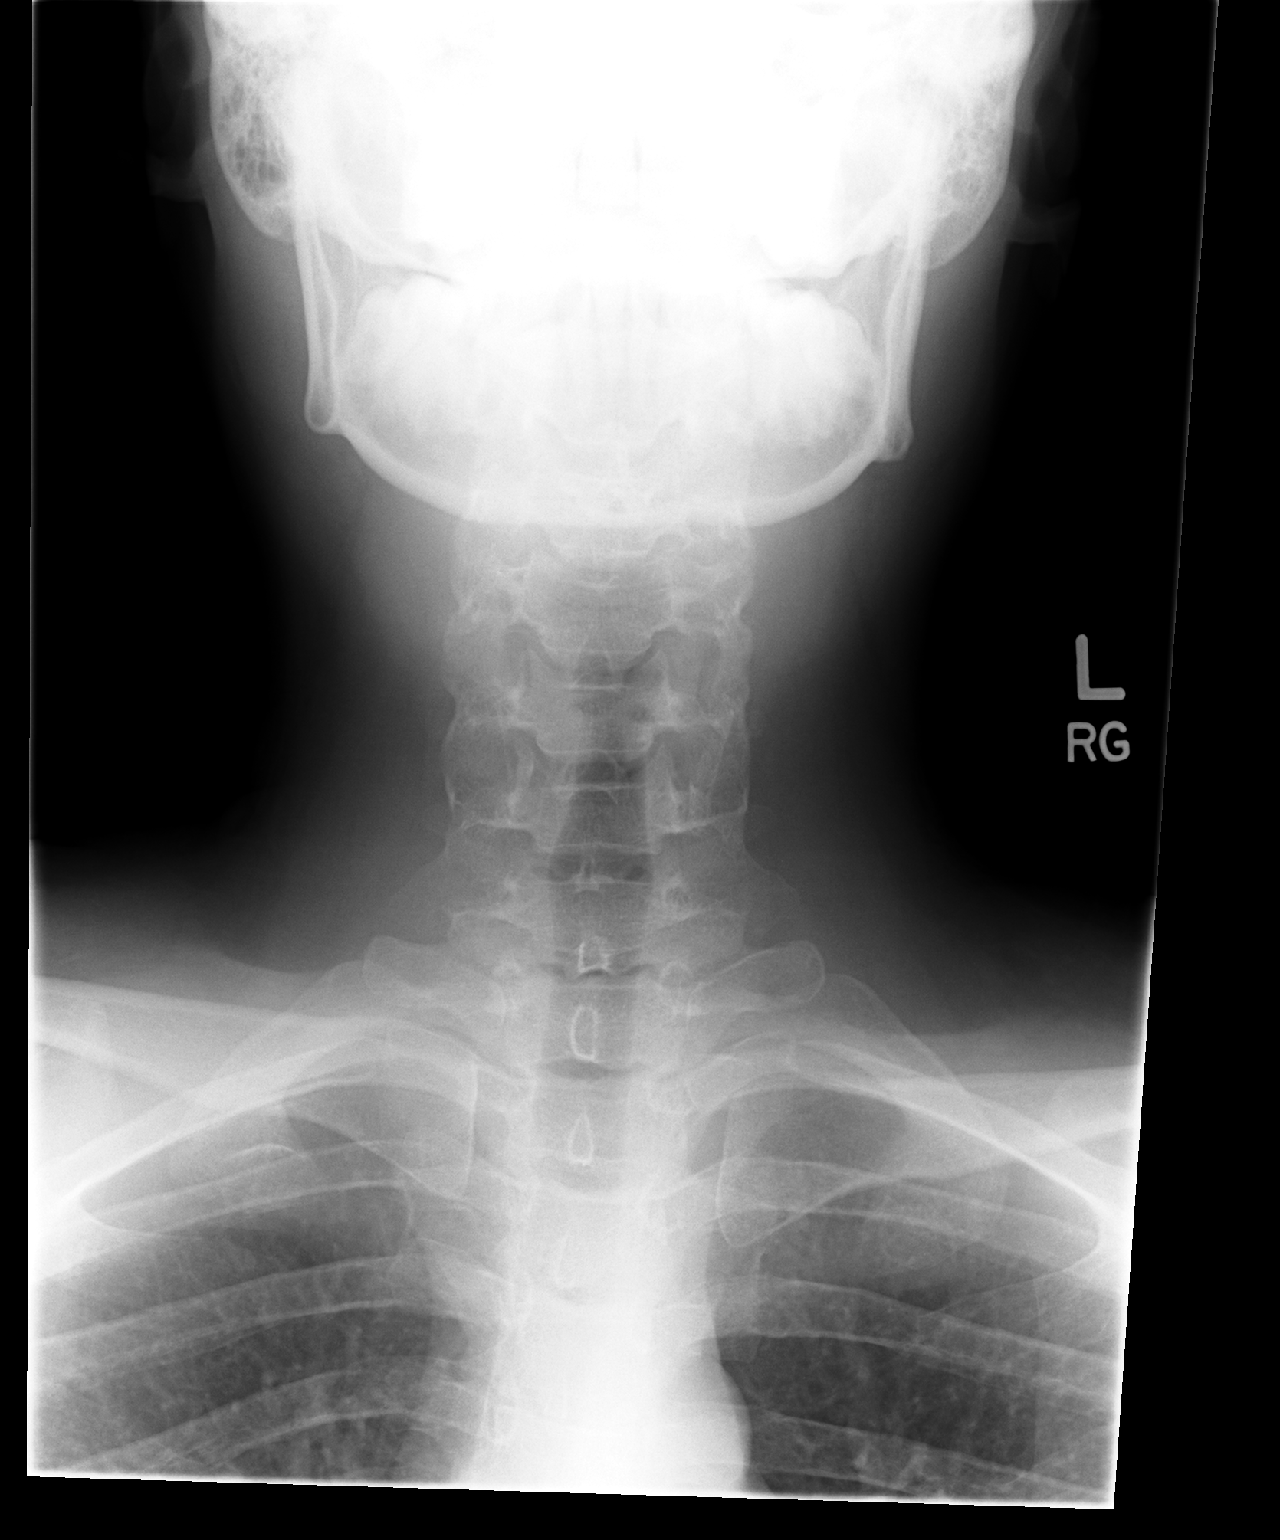

[view not recorded (2 of 3)]
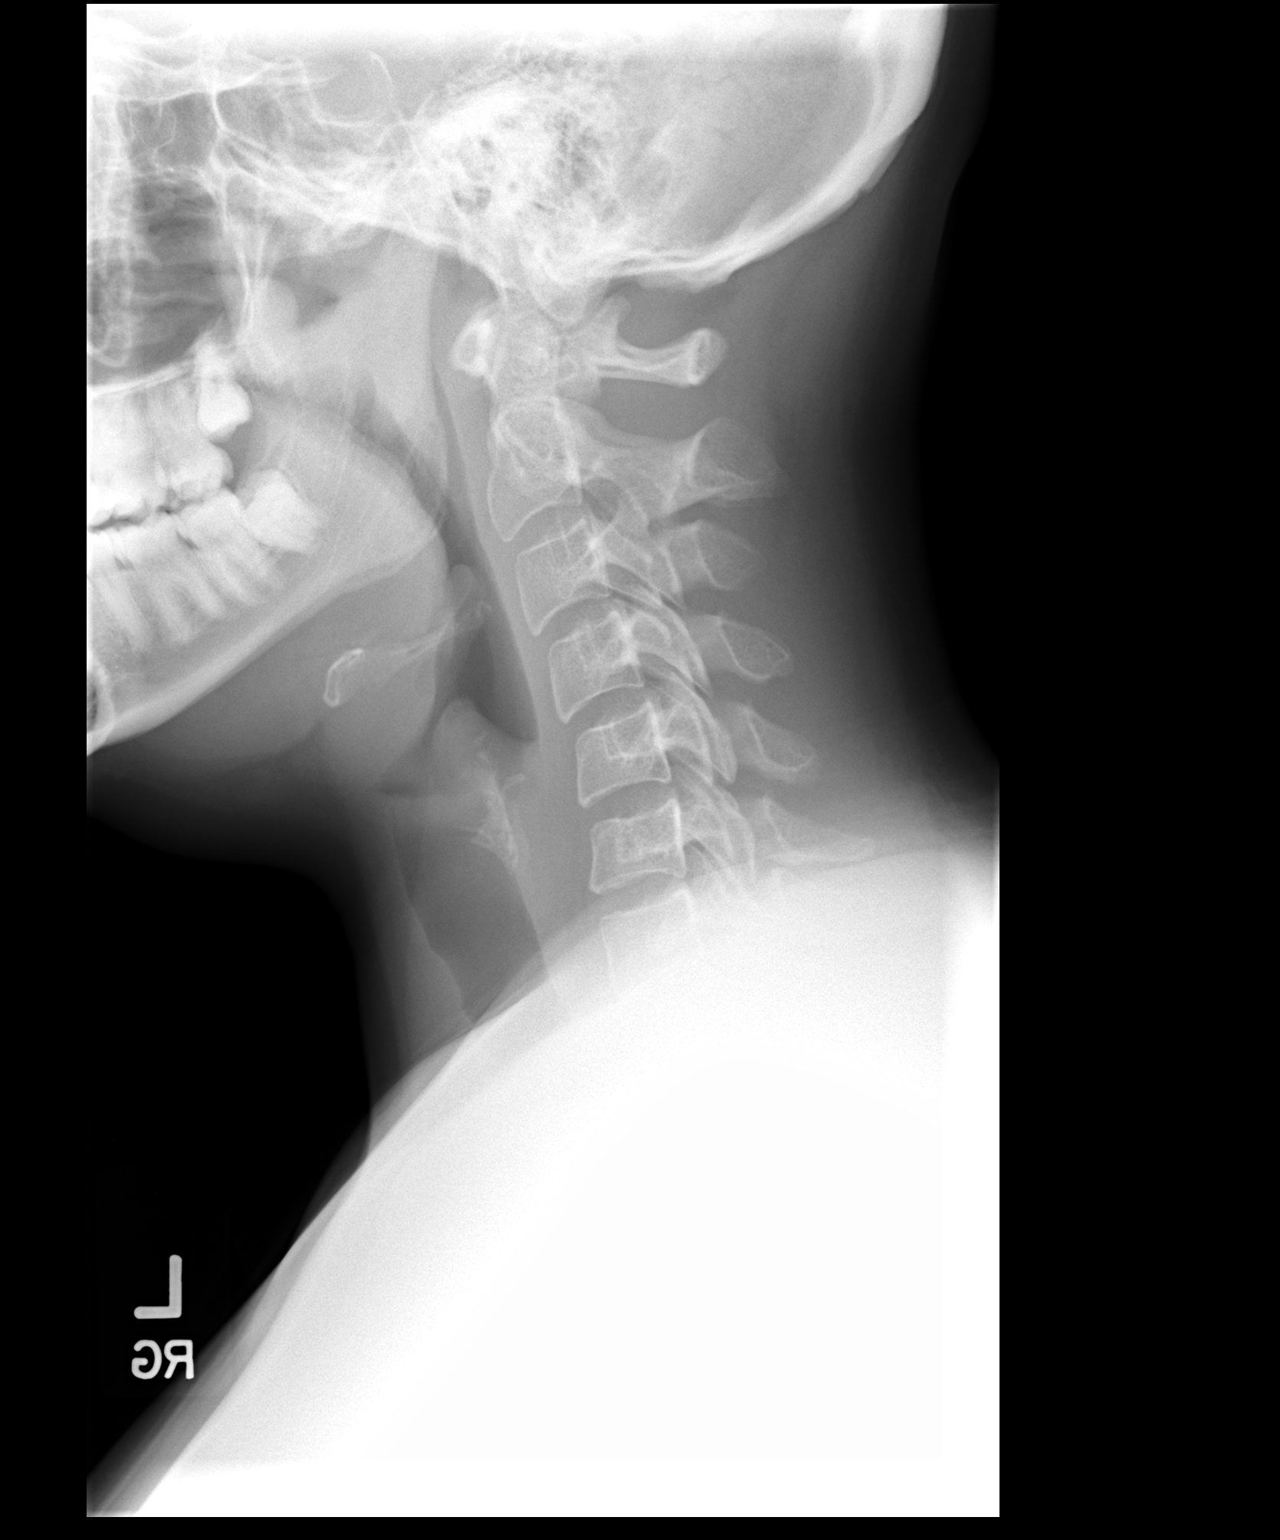

[view not recorded (3 of 3)]
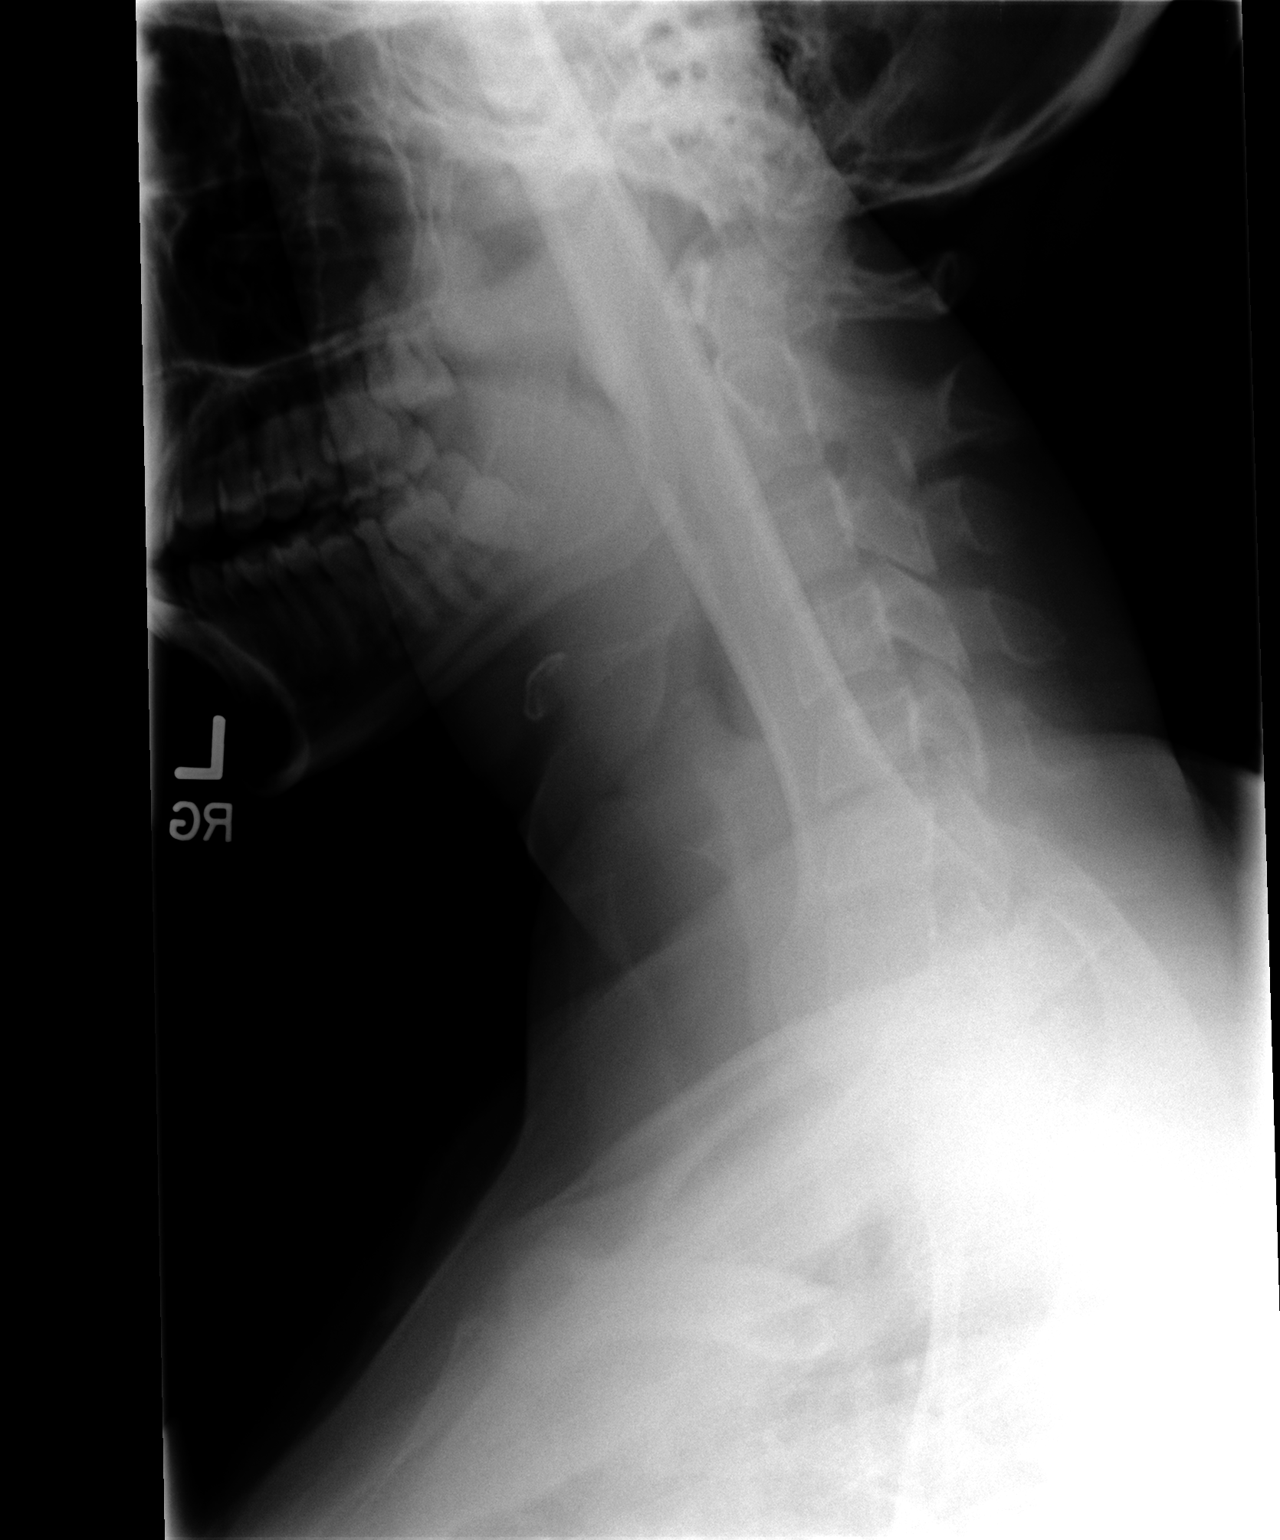

[3 of 3 positions shown; findings below may reference images not displayed]

FINDINGS: There is no disc space narrowing or subluxation.  No
facet arthritis.  Slight reversal of the cervical lordosis may be
due to muscle spasm.
IMPRESSION: No significant abnormality.

## 2014-07-05 ENCOUNTER — Other Ambulatory Visit: Payer: Self-pay | Admitting: Family Medicine

## 2014-08-11 ENCOUNTER — Other Ambulatory Visit: Payer: Self-pay | Admitting: Family Medicine

## 2014-08-15 LAB — ANA: ANA Titer 1: POSITIVE

## 2014-08-15 LAB — ENA 9 PANEL
SSA (RO) (ENA) ANTIBODY, IGG: POSITIVE
SSB (LA) (ENA) ANTIBODY, IGG: POSITIVE

## 2014-08-15 LAB — C-PEPTIDE: CCP Antibodies IgG/IgA: POSITIVE

## 2014-08-15 LAB — HEPATIC FUNCTION PANEL
ALT: 33 U/L (ref 7–35)
AST: 25 U/L (ref 13–35)

## 2014-08-15 LAB — BASIC METABOLIC PANEL WITH GFR: Creatinine: 0.7 mg/dL (ref 0.5–1.1)

## 2014-09-07 ENCOUNTER — Other Ambulatory Visit: Payer: Self-pay | Admitting: Family Medicine

## 2014-09-07 ENCOUNTER — Other Ambulatory Visit: Payer: Self-pay | Admitting: *Deleted

## 2014-09-07 MED ORDER — INSULIN DETEMIR 100 UNIT/ML FLEXPEN: 40.0000 [IU] | PEN_INJECTOR | Freq: Every day | SUBCUTANEOUS | Status: DC

## 2014-09-07 MED ORDER — EMPAGLIFLOZIN 10 MG PO TABS: ORAL_TABLET | ORAL | Status: DC

## 2014-09-11 ENCOUNTER — Other Ambulatory Visit: Payer: Self-pay | Admitting: Family Medicine

## 2014-10-03 ENCOUNTER — Ambulatory Visit (INDEPENDENT_AMBULATORY_CARE_PROVIDER_SITE_OTHER): Payer: BC Managed Care – PPO | Admitting: Family Medicine

## 2014-10-03 ENCOUNTER — Encounter: Payer: Self-pay | Admitting: Family Medicine

## 2014-10-03 DIAGNOSIS — E1165 Type 2 diabetes mellitus with hyperglycemia: Secondary | ICD-10-CM

## 2014-10-03 DIAGNOSIS — IMO0002 Reserved for concepts with insufficient information to code with codable children: Secondary | ICD-10-CM

## 2014-10-03 LAB — LIPID PANEL
Cholesterol: 117 mg/dL (ref 0–200)
HDL: 44 mg/dL — ABNORMAL LOW (ref >=46)
LDL Cholesterol: 60 mg/dL (ref 0–99)
Total CHOL/HDL Ratio: 2.7 Ratio
Triglycerides: 65 mg/dL (ref <150)
VLDL: 13 mg/dL (ref 0–40)

## 2014-10-03 LAB — COMPLETE METABOLIC PANEL WITH GFR
ALBUMIN: 4.6 g/dL (ref 3.5–5.2)
ALT: 13 U/L (ref 0–35)
AST: 19 U/L (ref 0–37)
Alkaline Phosphatase: 60 U/L (ref 39–117)
BUN: 13 mg/dL (ref 6–23)
CALCIUM: 9.6 mg/dL (ref 8.4–10.5)
CO2: 24 meq/L (ref 19–32)
Chloride: 103 mEq/L (ref 96–112)
Creat: 0.72 mg/dL (ref 0.50–1.10)
GFR, Est Non African American: >89 mL/min
GLUCOSE: 104 mg/dL — AB (ref 70–99)
Potassium: 4.3 mEq/L (ref 3.5–5.3)
SODIUM: 138 meq/L (ref 135–145)
TOTAL PROTEIN: 8.6 g/dL — AB (ref 6.0–8.3)
Total Bilirubin: 0.7 mg/dL (ref 0.2–1.2)

## 2014-10-03 LAB — POCT UA - MICROALBUMIN
Albumin/Creatinine Ratio, Urine, POC: <30
Creatinine, POC: 100 mg/dL
Microalbumin Ur, POC: 30 mg/L

## 2014-10-03 LAB — POCT GLYCOSYLATED HEMOGLOBIN (HGB A1C): HEMOGLOBIN A1C: 7.3

## 2014-10-03 MED ORDER — INSULIN DETEMIR 100 UNIT/ML FLEXPEN: 45.0000 [IU] | PEN_INJECTOR | Freq: Every day | SUBCUTANEOUS | Status: DC

## 2014-10-03 MED ORDER — EMPAGLIFLOZIN 10 MG PO TABS: ORAL_TABLET | ORAL | Status: DC

## 2014-10-03 MED ORDER — SAXAGLIPTIN-METFORMIN ER 5-1000 MG PO TB24: 1.0000 | ORAL_TABLET | Freq: Every day | ORAL | Status: DC

## 2014-10-03 MED ORDER — GLUCOSE BLOOD VI STRP: ORAL_STRIP | Status: DC

## 2014-10-03 NOTE — Addendum Note (Signed)
Addended by: Deno EtienneBARKLEY, Tyrika Newman L on: 10/03/2014 10:13 AM   Modules accepted: Medications

## 2014-10-03 NOTE — Progress Notes (Signed)
   Subjective:    Patient ID: Kendra Hart, female    DOB: Oct 04, 1985, 29 y.o.   MRN: [id number]  HPI Diabetes - no hypoglycemic events. No wounds or sores that are not healing well. No increased thirst or urination. Checking glucose at home. Taking medications as prescribed without any side effects. 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 She's been unable to come in because of that. She admits she has not been doing the best with food choices that she's been doing a lot of traveling with her team and eating late at night. But she said more recently as she is back on track with her diet and exercise and has actually been feeling better.    Review of Systems     Objective:   Physical Exam  Constitutional: She is oriented to person, place, and time. She appears well-developed and well-nourished.  HENT:  Head: Normocephalic and atraumatic.  Cardiovascular: Normal rate, regular rhythm and normal heart sounds.   Pulmonary/Chest: Effort normal and breath sounds normal.  Neurological: She is alert and oriented to person, place, and time.  Skin: Skin is warm and dry.  Psychiatric: She has a normal mood and affect. Her behavior is normal.          Assessment & Plan:  DM- uncontrolled. He will 11 A1c of 7.2 today. This is up from 7.0 last office visit. She is going back on track with diet and exercise would like to continue to work on this. Certainly she can increase her Levemir by couple units if needed. Otherwise follow-up in 3 months. Due for CMP and fasting lipid panel. Lab slip provided today. Coupon cards provided for [employer] PowerJardiance and Kombiglyze. Prescriptions refilled for 6 months to pharmacy. Urine microalbumin negative today for proteinuria.   She is overdue for her Pap smear. Encouraged her to schedule with a GYN in her area or certainly could come back here and have it done.

## 2014-10-25 ENCOUNTER — Encounter: Payer: Self-pay | Admitting: Family Medicine

## 2015-01-12 ENCOUNTER — Encounter: Payer: Self-pay | Admitting: Family Medicine

## 2015-01-12 ENCOUNTER — Ambulatory Visit (INDEPENDENT_AMBULATORY_CARE_PROVIDER_SITE_OTHER): Payer: BC Managed Care – PPO | Admitting: Family Medicine

## 2015-01-12 VITALS — BP 108/69 | HR 61 | Wt 188.0 lb

## 2015-01-12 DIAGNOSIS — E1165 Type 2 diabetes mellitus with hyperglycemia: Secondary | ICD-10-CM

## 2015-01-12 DIAGNOSIS — IMO0002 Reserved for concepts with insufficient information to code with codable children: Secondary | ICD-10-CM

## 2015-01-12 LAB — POCT GLYCOSYLATED HEMOGLOBIN (HGB A1C): Hemoglobin A1C: 7.1

## 2015-01-12 NOTE — Progress Notes (Signed)
   Subjective:    Patient ID: Kendra Hart, female    DOB: 04-04-1986, 29 y.o.   MRN: [id number]  HPI Diabetes - no hypoglycemic events. No wounds or sores that are not healing well. No increased thirst or urination. Checking glucose at home. Taking medications as prescribed without any side effects. She did start exercising recently.  Review of Systems     Objective:   Physical Exam  Constitutional: She is oriented to person, place, and time. She appears well-developed and well-nourished.  HENT:  Head: Normocephalic and atraumatic.  Cardiovascular: Normal rate, regular rhythm and normal heart sounds.   Pulmonary/Chest: Effort normal and breath sounds normal.  Neurological: She is alert and oriented to person, place, and time.  Skin: Skin is warm and dry.  Psychiatric: She has a normal mood and affect. Her behavior is normal.          Assessment & Plan:  DM- foot exam performed today. A1c is improved to 7.1 today. Previous was 7.3. We still need to get the A1c under 7. We discussed several options including switching to [facility] which is a little bit more forgiving if she misses doses etc. Or increasing the Levemir. Or possibly adding a medication such as Trulicity or Victoza. Right now she wants to stick with the Levemir. We will increase the dose to 47 or 48 units. She's also recently started exercising so this should help as well. Follow back up in 3 months.

## 2015-01-25 LAB — HM DIABETES EYE EXAM

## 2015-04-14 ENCOUNTER — Ambulatory Visit (INDEPENDENT_AMBULATORY_CARE_PROVIDER_SITE_OTHER): Payer: BC Managed Care – PPO | Admitting: Family Medicine

## 2015-04-14 ENCOUNTER — Encounter: Payer: Self-pay | Admitting: Family Medicine

## 2015-04-14 VITALS — BP 112/71 | HR 71 | Temp 98.0°F | Ht 67.0 in | Wt 193.0 lb

## 2015-04-14 DIAGNOSIS — Z23 Encounter for immunization: Secondary | ICD-10-CM

## 2015-04-14 DIAGNOSIS — J0100 Acute maxillary sinusitis, unspecified: Secondary | ICD-10-CM

## 2015-04-14 DIAGNOSIS — N760 Acute vaginitis: Secondary | ICD-10-CM

## 2015-04-14 DIAGNOSIS — E1165 Type 2 diabetes mellitus with hyperglycemia: Secondary | ICD-10-CM | POA: Diagnosis not present

## 2015-04-14 DIAGNOSIS — IMO0002 Reserved for concepts with insufficient information to code with codable children: Secondary | ICD-10-CM

## 2015-04-14 LAB — POCT GLYCOSYLATED HEMOGLOBIN (HGB A1C): Hemoglobin A1C: 7.1

## 2015-04-14 LAB — BASIC METABOLIC PANEL
BUN: 12 mg/dL (ref 7–25)
CHLORIDE: 102 mmol/L (ref 98–110)
CO2: 26 mmol/L (ref 20–31)
CREATININE: 0.68 mg/dL (ref 0.50–1.10)
Calcium: 9.1 mg/dL (ref 8.6–10.2)
Glucose, Bld: 85 mg/dL (ref 65–99)
Potassium: 4.5 mmol/L (ref 3.5–5.3)
Sodium: 136 mmol/L (ref 135–146)

## 2015-04-14 MED ORDER — SAXAGLIPTIN-METFORMIN ER 5-1000 MG PO TB24: 1.0000 | ORAL_TABLET | Freq: Every day | ORAL | Status: DC

## 2015-04-14 MED ORDER — EMPAGLIFLOZIN 10 MG PO TABS: ORAL_TABLET | ORAL | Status: DC

## 2015-04-14 MED ORDER — INSULIN DETEMIR 100 UNIT/ML FLEXPEN: 45.0000 [IU] | PEN_INJECTOR | Freq: Every day | SUBCUTANEOUS | Status: DC

## 2015-04-14 MED ORDER — AMOXICILLIN-POT CLAVULANATE 875-125 MG PO TABS: 1.0000 | ORAL_TABLET | Freq: Two times a day (BID) | ORAL | Status: DC

## 2015-04-14 NOTE — Progress Notes (Signed)
   Subjective:    Patient ID: Kendra Hart, female    DOB: 02/04/1986, 29 y.o.   MRN: [id number]  HPI Diabetes - no hypoglycemic events. No wounds or sores that are not healing well. No increased thirst or urination. Checking glucose at home. Taking medications as prescribed without any side effects.  Vaginitis x 1 week. Says has had a fish smell and some irritaiton.   Had BV a month ago.    Sinus pain and pressure with yellow/green mucous x 2 weeks.  No fever, chills or sweats.  Says face near her nasal bridge is very tender to touch. Some bilat ear pain.  No ST.  Getting really nauseated from teh post nasal drip. Not using any cougg/coldmeds.   Review of Systems     Objective:   Physical Exam  Constitutional: She is oriented to person, place, and time. She appears well-developed and well-nourished.  HENT:  Head: Normocephalic and atraumatic.  Right Ear: External ear normal.  Left Ear: External ear normal.  Nose: Nose normal.  Mouth/Throat: Oropharynx is clear and moist.  TMs and canals are clear.   Eyes: Conjunctivae and EOM are normal. Pupils are equal, round, and reactive to light.  Neck: Neck supple. No thyromegaly present.  Cardiovascular: Normal rate, regular rhythm and normal heart sounds.   Pulmonary/Chest: Effort normal and breath sounds normal. She has no wheezes.  Lymphadenopathy:    She has no cervical adenopathy.  Neurological: She is alert and oriented to person, place, and time.  Skin: Skin is warm and dry.  Psychiatric: She has a normal mood and affect.          Assessment & Plan:  DM- stable at 7.1. Uncontrolled but close. Will inc levemir to 48 units and she plans on starting regular exercise routine. F/U in 3  Mot[facility].    Vaginitis - KOH performed.  Will call with reuslts.    Acute sinusitis - will tx with augmentin. Call if not better in one week.

## 2015-04-15 LAB — WET PREP, GENITAL
TRICH WET PREP: NONE SEEN
YEAST WET PREP: NONE SEEN

## 2015-04-15 MED ORDER — METRONIDAZOLE 500 MG PO TABS: 500.0000 mg | ORAL_TABLET | Freq: Two times a day (BID) | ORAL | Status: DC

## 2015-04-15 NOTE — Addendum Note (Signed)
Addended by: Nani Gasser D on: 04/15/2015 08:08 AM   Modules accepted: Orders

## 2015-05-01 ENCOUNTER — Other Ambulatory Visit: Payer: Self-pay | Admitting: *Deleted

## 2015-05-01 MED ORDER — FLUCONAZOLE 150 MG PO TABS: 150.0000 mg | ORAL_TABLET | Freq: Once | ORAL | Status: DC

## 2015-08-03 ENCOUNTER — Ambulatory Visit: Payer: BC Managed Care – PPO | Admitting: Family Medicine

## 2015-10-10 ENCOUNTER — Other Ambulatory Visit: Payer: Self-pay | Admitting: *Deleted

## 2015-10-10 DIAGNOSIS — IMO0001 Reserved for inherently not codable concepts without codable children: Secondary | ICD-10-CM

## 2015-10-10 DIAGNOSIS — E1165 Type 2 diabetes mellitus with hyperglycemia: Principal | ICD-10-CM

## 2015-10-10 MED ORDER — SAXAGLIPTIN-METFORMIN ER 5-1000 MG PO TB24: 1.0000 | ORAL_TABLET | Freq: Every day | ORAL | Status: DC

## 2015-10-17 ENCOUNTER — Telehealth: Payer: Self-pay | Admitting: *Deleted

## 2015-10-17 NOTE — Telephone Encounter (Signed)
Need to initiate PA on patient's [employer]. We need updated insurance info as card scanned in is over a year old. Called patient and vm was full

## 2015-10-26 NOTE — Telephone Encounter (Signed)
Initiated PA on through [employer]

## 2015-10-31 NOTE — Telephone Encounter (Signed)
Kendra Hart has been approved through insurance from 10-30-2015 through 10-29-2016  Patient's mailbox is full

## 2015-11-02 ENCOUNTER — Telehealth: Payer: Self-pay | Admitting: *Deleted

## 2015-11-02 NOTE — Telephone Encounter (Signed)
Patient called ands states the kombiglyze is too expensive for her. She states it was $200 even with the savings card. She wants to know if there is an alternative she can take

## 2015-11-03 NOTE — Telephone Encounter (Signed)
That she noticed they will cover Xigduo? It has [place] in it which is very similar to her [place] PepperJardiance and then a second medication which is similar to the onglyza. But it would be all in one. Then she could take the metformin separately which is very cheap and an expensive.

## 2015-11-03 NOTE — Telephone Encounter (Signed)
vm is full.

## 2015-11-08 NOTE — Telephone Encounter (Signed)
Called again and vm is full °

## 2015-11-09 NOTE — Telephone Encounter (Signed)
Pt called back and she has a high deductible. She is ok with starting xigduo and taking the metformin separately.

## 2015-11-13 MED ORDER — METFORMIN HCL 1000 MG PO TABS: 1000.0000 mg | ORAL_TABLET | Freq: Two times a day (BID) | ORAL | Status: DC

## 2015-11-13 MED ORDER — EMPAGLIFLOZIN-LINAGLIPTIN 25-5 MG PO TABS: 1.0000 | ORAL_TABLET | Freq: Every day | ORAL | Status: DC

## 2015-11-13 NOTE — Telephone Encounter (Signed)
Sorry meant [employer]. May see if we have any coupons as well. Both Rx sent.

## 2015-11-15 NOTE — Telephone Encounter (Signed)
Left message on vm and asked to call back

## 2015-11-20 NOTE — Telephone Encounter (Signed)
Message left for her to call back

## 2015-11-27 NOTE — Telephone Encounter (Signed)
Closing encounter

## 2016-01-11 ENCOUNTER — Other Ambulatory Visit: Payer: Self-pay | Admitting: Family Medicine

## 2016-01-22 ENCOUNTER — Encounter: Payer: Self-pay | Admitting: Family Medicine

## 2016-01-22 ENCOUNTER — Ambulatory Visit (INDEPENDENT_AMBULATORY_CARE_PROVIDER_SITE_OTHER): Payer: BC Managed Care – PPO | Admitting: Family Medicine

## 2016-01-22 VITALS — BP 108/71 | HR 71 | Wt 186.0 lb

## 2016-01-22 DIAGNOSIS — IMO0001 Reserved for inherently not codable concepts without codable children: Secondary | ICD-10-CM

## 2016-01-22 DIAGNOSIS — E1165 Type 2 diabetes mellitus with hyperglycemia: Secondary | ICD-10-CM

## 2016-01-22 LAB — COMPLETE METABOLIC PANEL WITH GFR
ALBUMIN: 4.4 g/dL (ref 3.6–5.1)
ALK PHOS: 67 U/L (ref 33–115)
ALT: 11 U/L (ref 6–29)
AST: 18 U/L (ref 10–30)
BILIRUBIN TOTAL: 0.8 mg/dL (ref 0.2–1.2)
BUN: 12 mg/dL (ref 7–25)
CALCIUM: 9.4 mg/dL (ref 8.6–10.2)
CO2: 24 mmol/L (ref 20–31)
CREATININE: 0.7 mg/dL (ref 0.50–1.10)
Chloride: 102 mmol/L (ref 98–110)
GFR, Est African American: >89 mL/min (ref >=60)
GFR, Est Non African American: >89 mL/min (ref >=60)
Glucose, Bld: 94 mg/dL (ref 65–99)
POTASSIUM: 4.5 mmol/L (ref 3.5–5.3)
Sodium: 138 mmol/L (ref 135–146)
Total Protein: 8 g/dL (ref 6.1–8.1)

## 2016-01-22 LAB — LIPID PANEL
CHOL/HDL RATIO: 2.6 ratio (ref <=5.0)
CHOLESTEROL: 118 mg/dL — AB (ref 125–200)
HDL: 45 mg/dL — AB (ref >=46)
LDL CALC: 63 mg/dL (ref <130)
Triglycerides: 48 mg/dL (ref <150)
VLDL: 10 mg/dL (ref <30)

## 2016-01-22 LAB — POCT UA - MICROALBUMIN
Albumin/Creatinine Ratio, Urine, POC: <30
Creatinine, POC: 100 mg/dL
MICROALBUMIN (UR) POC: 10 mg/L

## 2016-01-22 LAB — POCT GLYCOSYLATED HEMOGLOBIN (HGB A1C): Hemoglobin A1C: 7.7

## 2016-01-22 MED ORDER — METFORMIN HCL 1000 MG PO TABS: 1000.0000 mg | ORAL_TABLET | Freq: Two times a day (BID) | ORAL | Status: DC

## 2016-01-22 MED ORDER — EMPAGLIFLOZIN 10 MG PO TABS: 10.0000 mg | ORAL_TABLET | Freq: Every day | ORAL | Status: DC

## 2016-01-22 MED ORDER — INSULIN DETEMIR 100 UNIT/ML FLEXPEN: 50.0000 [IU] | PEN_INJECTOR | Freq: Every day | SUBCUTANEOUS | Status: DC

## 2016-01-22 MED ORDER — INSULIN DETEMIR 100 UNIT/ML FLEXPEN: 45.0000 [IU] | PEN_INJECTOR | Freq: Every day | SUBCUTANEOUS | Status: DC

## 2016-01-22 MED ORDER — EMPAGLIFLOZIN-LINAGLIPTIN 25-5 MG PO TABS: 1.0000 | ORAL_TABLET | Freq: Every day | ORAL | Status: DC

## 2016-01-22 NOTE — Progress Notes (Signed)
Subjective:    CC: DM  HPI: Diabetes - no hypoglycemic events. No wounds or sores that are not healing well. No increased thirst or urination. Checking glucose at home. Taking medications as prescribed without any side effects.She is currently on Jardiance and metformin and Levemir.  We tried her on a combination medication but unfortunately her insurance would not cover those medications. She's currently using 45 units of her Levemir.  She has been under a lot of stress recently but has started exercising again.     Past medical history, Surgical history, Family history not pertinant except as noted below, Social history, Allergies, and medications have been entered into the medical record, reviewed, and corrections made.   Review of Systems: No fevers, chills, night sweats, weight loss, chest pain, or shortness of breath.   Objective:    General: Well Developed, well nourished, and in no acute distress.  Neuro: Alert and oriented x3, extra-ocular muscles intact, sensation grossly intact.  HEENT: Normocephalic, atraumatic  Skin: Warm and dry, no rashes. Cardiac: Regular rate and rhythm, no murmurs rubs or gallops, no lower extremity edema.  Respiratory: Clear to auscultation bilaterally. Not using accessory muscles, speaking in full sentences.   Impression and Recommendations:    DM- Uncontrolled. Hemoglobin A1c 7.7 today which is up from previous of 7.1. Increase Levemir to 50 units. Encouraged her to stay at that for a week and then after that she can go up by 1 unit each day until her fasting sugar is under 130. Encourage her to get back on track with diet and exercise. Follow-up in 3 months.

## 2016-01-22 NOTE — Patient Instructions (Signed)
Increase Levemir to 50 units. Stay at that for a week and then after that you can go up by 1 unit each day until your fasting sugar is under 130.

## 2016-02-08 ENCOUNTER — Other Ambulatory Visit: Payer: Self-pay | Admitting: *Deleted

## 2016-02-08 MED ORDER — GLUCOSE BLOOD VI STRP: ORAL_STRIP | Status: AC

## 2016-10-08 LAB — CBC AND DIFFERENTIAL
HCT: 38 % (ref 36–46)
HEMOGLOBIN: 12.1 g/dL (ref 12.0–16.0)
NEUTROS ABS: 4 /uL
PLATELETS: 308 10*3/uL (ref 150–399)
WBC: 6.7 10^3/mL

## 2016-10-08 LAB — HEPATIC FUNCTION PANEL
ALT: 22 U/L (ref 7–35)
AST: 24 U/L (ref 13–35)
BILIRUBIN, TOTAL: 0.9 mg/dL

## 2016-10-08 LAB — BASIC METABOLIC PANEL
BUN: 16 mg/dL (ref 4–21)
Creatinine: 0.7 mg/dL (ref 0.5–1.1)
Glucose: 162 mg/dL
Potassium: 4.5 mmol/L (ref 3.4–5.3)
Sodium: 139 mmol/L (ref 137–147)

## 2016-10-20 ENCOUNTER — Other Ambulatory Visit: Payer: Self-pay | Admitting: Family Medicine

## 2016-10-22 ENCOUNTER — Encounter: Payer: Self-pay | Admitting: Family Medicine

## 2016-11-24 ENCOUNTER — Other Ambulatory Visit: Payer: Self-pay | Admitting: Family Medicine

## 2016-12-11 LAB — HM DIABETES EYE EXAM

## 2016-12-24 ENCOUNTER — Encounter: Payer: Self-pay | Admitting: Family Medicine

## 2017-01-15 ENCOUNTER — Ambulatory Visit (INDEPENDENT_AMBULATORY_CARE_PROVIDER_SITE_OTHER): Payer: BC Managed Care – PPO | Admitting: Family Medicine

## 2017-01-15 ENCOUNTER — Encounter: Payer: Self-pay | Admitting: Family Medicine

## 2017-01-15 VITALS — BP 111/67 | HR 70 | Ht 66.63 in | Wt 183.0 lb

## 2017-01-15 DIAGNOSIS — N760 Acute vaginitis: Secondary | ICD-10-CM

## 2017-01-15 DIAGNOSIS — M329 Systemic lupus erythematosus, unspecified: Secondary | ICD-10-CM | POA: Diagnosis not present

## 2017-01-15 DIAGNOSIS — E1165 Type 2 diabetes mellitus with hyperglycemia: Secondary | ICD-10-CM | POA: Diagnosis not present

## 2017-01-15 DIAGNOSIS — IMO0001 Reserved for inherently not codable concepts without codable children: Secondary | ICD-10-CM

## 2017-01-15 DIAGNOSIS — J4520 Mild intermittent asthma, uncomplicated: Secondary | ICD-10-CM | POA: Diagnosis not present

## 2017-01-15 LAB — COMPLETE METABOLIC PANEL WITH GFR
ALT: 10 U/L (ref 6–29)
AST: 16 U/L (ref 10–30)
Albumin: 4.1 g/dL (ref 3.6–5.1)
Alkaline Phosphatase: 59 U/L (ref 33–115)
BILIRUBIN TOTAL: 0.7 mg/dL (ref 0.2–1.2)
BUN: 10 mg/dL (ref 7–25)
CHLORIDE: 104 mmol/L (ref 98–110)
CO2: 25 mmol/L (ref 20–31)
Calcium: 8.9 mg/dL (ref 8.6–10.2)
Creat: 0.77 mg/dL (ref 0.50–1.10)
GFR, Est African American: >89 mL/min (ref >=60)
GLUCOSE: 106 mg/dL — AB (ref 65–99)
POTASSIUM: 4.1 mmol/L (ref 3.5–5.3)
SODIUM: 138 mmol/L (ref 135–146)
TOTAL PROTEIN: 7.5 g/dL (ref 6.1–8.1)

## 2017-01-15 LAB — LIPID PANEL W/REFLEX DIRECT LDL
CHOL/HDL RATIO: 3.1 ratio (ref <5.0)
Cholesterol: 128 mg/dL (ref <200)
HDL: 41 mg/dL — AB (ref >50)
LDL-CHOLESTEROL: 69 mg/dL
NON-HDL CHOLESTEROL (CALC): 87 mg/dL (ref <130)
Triglycerides: 98 mg/dL (ref <150)

## 2017-01-15 LAB — POCT UA - MICROALBUMIN
CREATININE, POC: 200 mg/dL
MICROALBUMIN (UR) POC: 80 mg/L

## 2017-01-15 LAB — POCT GLYCOSYLATED HEMOGLOBIN (HGB A1C): HEMOGLOBIN A1C: 7.9

## 2017-01-15 MED ORDER — INSULIN DETEMIR 100 UNIT/ML FLEXPEN: 50.0000 [IU] | PEN_INJECTOR | Freq: Every day | SUBCUTANEOUS | 6 refills | Status: DC

## 2017-01-15 MED ORDER — FLUCONAZOLE 150 MG PO TABS: 150.0000 mg | ORAL_TABLET | Freq: Once | ORAL | 2 refills | Status: AC

## 2017-01-15 MED ORDER — EMPAGLIFLOZIN-METFORMIN HCL ER 10-1000 MG PO TB24
1.0000 | ORAL_TABLET | Freq: Every day | ORAL | 11 refills | Status: AC
Start: 2017-01-15 — End: ?

## 2017-01-15 MED ORDER — ALBUTEROL SULFATE HFA 108 (90 BASE) MCG/ACT IN AERS: 2.0000 | INHALATION_SPRAY | Freq: Four times a day (QID) | RESPIRATORY_TRACT | 2 refills | Status: AC | PRN

## 2017-01-15 NOTE — Progress Notes (Signed)
Subjective:    CC: DM  HPI:  Diabetes - no hypoglycemic events. No wounds or sores that are not healing well. No increased thirst or urination. Checking glucose at home. Taking medications as prescribed without any side effects.Currently on Jardiance and metformin and Levemir.  [place] [employer] [facility] [employer].  Out of her jardiance for 2 weeks.    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 Did have to use her albuterol a couple of weeks ago with exercise.  She still has an old inhaler.    Vaginitis - having some irritation and itching. Often occurs after sex but not always. Says she has noticed it more in the last year or so.  She would like diflucan to clear this up.  The say OTC monistat is irritating her skin.    Lupus-she's currently on Plaquenil and gets her eye exams regularly. He has been doing well overall but has been having some right knee pain and problems. She injured it during the basketball season.  BP 111/67   Pulse 70   Ht 5' 6.63" (1.693 m)   Wt 183 lb (83 kg)   LMP 12/31/2016 (Approximate)   SpO2 100%   BMI 28.98 kg/m     Allergies  Allergen Reactions  . Etodolac     REACTION: hives, SOB  . Doxycycline Nausea And Vomiting    Past Medical History:  Diagnosis Date  . EXERCISE INDUCED ASTHMA 05/20/2006   Qualifier: Diagnosis of  By: Linford ArnoldMetheney MD, Santina Evansatherine    . SCOLIOSIS 05/20/2006   Qualifier: Diagnosis of  By: Linford ArnoldMetheney MD, Santina Evansatherine    . SLE 10/18/2008   Qualifier: Diagnosis of  By: Linford ArnoldMetheney MD, Santina Evansatherine      No past surgical history on file.  Social History   Social History  . Marital status: Married    Spouse name: N/A  . Number of children: 0  . Years of education: N/A   Occupational History  . Not on file.   Social History Main Topics  . Smoking status: Never Smoker  . Smokeless tobacco: Never Used  . Alcohol use Not on file  . Drug use: Unknown  . Sexual activity: Not on file   Other Topics Concern   . Not on file   Social History Narrative   Married. Husband is a type 1 diabetic.     No family history on file.  Outpatient Encounter Prescriptions as of 01/15/2017  Medication Sig  . AMBULATORY NON FORMULARY MEDICATION Medication Name: Glucometer, strips and lancets.  Test 1 x a day.  On insulin 250.00  . glucose blood (ACCU-CHEK AVIVA PLUS) test strip USE TO CHECK BLOOD SUGAR ONCE DAILY. Dx:E11.65  . hydroxychloroquine (PLAQUENIL) 200 MG tablet Take 200 mg by mouth 2 (two) times daily.  . Insulin Detemir (LEVEMIR FLEXPEN) 100 UNIT/ML Pen Inject 50-60 Units into the skin at bedtime.  . Insulin Pen Needle (BD PEN NEEDLE NANO U/F) 32G X 4 MM MISC Inject  daily  . Multiple Vitamins tablet 1 tablet. Take 1 tablet by mouth daily.  . Omega-3 1000 MG CAPS Take 1 g by mouth daily.  . [DISCONTINUED] Insulin Detemir (LEVEMIR FLEXPEN) 100 UNIT/ML Pen Inject 50-60 Units into the skin at bedtime.  . [DISCONTINUED] metFORMIN (GLUCOPHAGE) 1000 MG tablet Take 1 tablet (1,000 mg total) by mouth 2 (two) times daily with a meal.  . albuterol (PROVENTIL HFA;VENTOLIN HFA) 108 (90 Base) MCG/ACT inhaler Inhale 2 puffs into  the lungs every 6 (six) hours as needed for wheezing or shortness of breath.  . Empagliflozin-Metformin HCl ER (SYNJARDY XR) 05-999 MG TB24 Take 1 tablet by mouth daily.  . fluconazole (DIFLUCAN) 150 MG tablet Take 1 tablet (150 mg total) by mouth once.  . [DISCONTINUED] JARDIANCE 10 MG TABS tablet TAKE (10 MG) 1 TABLET BY MOUTH DAILY. APPOINTMENT REQUIRED FOR FUTURE REFILLS. (Patient not taking: Reported on 01/15/2017)  . [DISCONTINUED] VITAMIN D, ERGOCALCIFEROL, PO Take by mouth.   No facility-administered encounter medications on file as of 01/15/2017.        Objective:    General: Well Developed, well nourished, and in no acute distress.  Neuro: Alert and oriented x3, extra-ocular muscles intact, sensation grossly intact.  HEENT: Normocephalic, atraumatic  Skin: Warm and dry, no  rashes. Cardiac: Regular rate and rhythm, no murmurs rubs or gallops, no lower extremity edema.  Respiratory: Clear to auscultation bilaterally. Not using accessory muscles, speaking in full sentences.   Impression and Recommendations:   DM - 1 her to restart her medication that she has been out of them. A medical head and switch her to [place] so that she has a combination of metformin and Jardiance. Coupon card provided to reduce her co-pay. Did refill it for a year. That exam performed today. Her eye exam is up-to-date. Increase Levemir by 2 units each week.    Exercise induced asthma - Refilled her albuterol inhaler to use as needed. Also encouraged her to use 2 puffs before exercise if needed especially if she's can be out in the heat or if she is Artie having difficulty with pollen and spring allergies.   Lupus-she's currently on Plaquenil and gets her eye exams regularly. She will need to find a new rheumatologist in [place].  Yeast vaginitis-we'll treat with oral Diflucan. Explained that the Jardiance does increase her risk of getting recurrent urinary tract infections as well as yeast infections. First I encouraged her though to get her A1c under better control and see if the infection seemed to resolve. If not and it may be worth looking at changing her Jardiance.  I wish her the best of luck with her move and her new job. I will missed her. I'll been taking care of her for several years and she has been an absolute pleasure.

## 2017-02-24 ENCOUNTER — Telehealth: Payer: Self-pay

## 2017-02-24 MED ORDER — METFORMIN HCL 1000 MG PO TABS: 1000.0000 mg | ORAL_TABLET | Freq: Two times a day (BID) | ORAL | 3 refills | Status: AC

## 2017-02-24 MED ORDER — EMPAGLIFLOZIN 25 MG PO TABS: 25.0000 mg | ORAL_TABLET | Freq: Every day | ORAL | 1 refills | Status: AC

## 2017-02-24 NOTE — Telephone Encounter (Signed)
Okay, will send of her prescriptions to separate the 2 drugs for Jardiance and metformin and see if this is covered.

## 2017-02-24 NOTE — Telephone Encounter (Signed)
[facility] [employer]eds. Drug is not covered by plan.

## 2017-02-25 NOTE — Telephone Encounter (Signed)
Left message on patient vm to call the office back regarding medication therapy change. Bethanny Toelle,CMA

## 2017-02-26 NOTE — Telephone Encounter (Signed)
Patient notified

## 2017-02-26 NOTE — Telephone Encounter (Signed)
Mailbox full

## 2017-11-27 ENCOUNTER — Other Ambulatory Visit: Payer: Self-pay | Admitting: Family Medicine

## 2017-12-23 ENCOUNTER — Other Ambulatory Visit: Payer: Self-pay | Admitting: Family Medicine

## 2017-12-23 NOTE — Telephone Encounter (Signed)
Called pt to inform her that she would need to contact her current pcp to get this medication refilled filled for her since it has been over 90 days since she has been here. Laureen Ochs, Viann Shove, CMA
